# Patient Record
Sex: Male | Born: 1945 | Race: White | Hispanic: No | Marital: Married | Smoking: Former smoker
Health system: Southern US, Community
[De-identification: ages and names within clinical notes are randomized; demographics above are authoritative.]

## PROBLEM LIST (undated history)

## (undated) DIAGNOSIS — G4733 Obstructive sleep apnea (adult) (pediatric): Secondary | ICD-10-CM

## (undated) DIAGNOSIS — I2510 Atherosclerotic heart disease of native coronary artery without angina pectoris: Secondary | ICD-10-CM

## (undated) DIAGNOSIS — I4891 Unspecified atrial fibrillation: Secondary | ICD-10-CM

## (undated) DIAGNOSIS — I10 Essential (primary) hypertension: Secondary | ICD-10-CM

## (undated) DIAGNOSIS — C4491 Basal cell carcinoma of skin, unspecified: Secondary | ICD-10-CM

## (undated) DIAGNOSIS — E785 Hyperlipidemia, unspecified: Secondary | ICD-10-CM

## (undated) DIAGNOSIS — I509 Heart failure, unspecified: Secondary | ICD-10-CM

## (undated) DIAGNOSIS — Z8739 Personal history of other diseases of the musculoskeletal system and connective tissue: Secondary | ICD-10-CM

## (undated) DIAGNOSIS — K219 Gastro-esophageal reflux disease without esophagitis: Secondary | ICD-10-CM

## (undated) DIAGNOSIS — E119 Type 2 diabetes mellitus without complications: Secondary | ICD-10-CM

## (undated) HISTORY — DX: Essential (primary) hypertension: I10

## (undated) HISTORY — PX: INGUINAL HERNIA REPAIR: SUR1180

## (undated) HISTORY — PX: CARDIAC CATHETERIZATION: SHX172

## (undated) HISTORY — DX: Heart failure, unspecified: I50.9

## (undated) HISTORY — PX: KNEE ARTHROSCOPY: SHX127

## (undated) HISTORY — DX: Hyperlipidemia, unspecified: E78.5

## (undated) HISTORY — PX: REFRACTIVE SURGERY: SHX103

## (undated) HISTORY — PX: TONSILLECTOMY: SUR1361

## (undated) HISTORY — DX: Obstructive sleep apnea (adult) (pediatric): G47.33

## (undated) HISTORY — DX: Unspecified atrial fibrillation: I48.91

## (undated) HISTORY — PX: MOHS SURGERY: SUR867

## (undated) HISTORY — PX: WRIST FUSION: SHX839

## (undated) HISTORY — PX: EXCISIONAL HEMORRHOIDECTOMY: SHX1541

---

## 1974-03-09 HISTORY — PX: NASAL SEPTUM SURGERY: SHX37

## 2004-03-09 HISTORY — PX: SHOULDER OPEN ROTATOR CUFF REPAIR: SHX2407

## 2013-05-03 ENCOUNTER — Other Ambulatory Visit (HOSPITAL_COMMUNITY): Payer: Self-pay | Admitting: Family Medicine

## 2013-05-03 DIAGNOSIS — R9389 Abnormal findings on diagnostic imaging of other specified body structures: Secondary | ICD-10-CM

## 2013-05-08 ENCOUNTER — Telehealth (HOSPITAL_COMMUNITY): Payer: Self-pay | Admitting: *Deleted

## 2013-05-08 NOTE — Telephone Encounter (Signed)
MRI Screening  Pt Weight not in MRI (If over 300 lbs, notify MRI technologist)  Claustrophobia yes  Ever worked around metal, filing, grinding, or welding? no  Always wore protective glasses? yes  Ever got metal in your eyes? no If yes, was it removed by a doctor? no  Brain Surgery no   Inner Ear Surgery no  Renal/Liver Dz no  Heart Surgery no (if yes, what type and when 0 )  Pacemaker no  High BP no  Eye Implants no  Pregnancy no  Diabetic no  Stent no (If yes, date of placement 0 )  Hx of cancer no (If yes, what kind? 0 )   1.  Report to radiology on first floor on thur  At 1000am  2.  Do Not eat food after 4am          Do Not drink clear liquids after 8am  3.  If discharged the same day as procedure, you will need a responsible adult to drive          You home.  Who will drive you home wife   4.  If discharged the day of your procedure, a responsible adult should be with the patient      For 8 hours    wife 5.  If plans include a taxi or bus for transportation home after procedure, a friend or family      member must accompany you none       6.  If taking routine medications, please list the names and dosages of all your                   medications, or bring these medications to the hospital for identification allipurinol, atorvastatin,omeprazole, temazepam, trazadone, brilintix.  7.  Take usual medications the morning of your procedure, except none  8.  Do you have a history of heart problems?  none   9.  Do you have a cardiologist? n/a  10.  Do you have any metal or implants inside your body? no 11.  Do you have any allergies to food or medications? no 12.  Do you have any allergies to latex or contrast dye? no 13.  To whom were these instructions given? no

## 2013-05-11 ENCOUNTER — Other Ambulatory Visit (HOSPITAL_COMMUNITY): Payer: Self-pay | Admitting: Family Medicine

## 2013-05-11 ENCOUNTER — Ambulatory Visit (HOSPITAL_COMMUNITY)
Admission: RE | Admit: 2013-05-11 | Discharge: 2013-05-11 | Disposition: A | Discharge status: Home / Self Care | Payer: 59 | Source: Ambulatory Visit | Attending: Family Medicine | Admitting: Family Medicine

## 2013-05-11 ENCOUNTER — Encounter (HOSPITAL_COMMUNITY): Payer: Self-pay

## 2013-05-11 ENCOUNTER — Ambulatory Visit (HOSPITAL_COMMUNITY): Payer: 59 | Admitting: Anesthesiology

## 2013-05-11 ENCOUNTER — Encounter (HOSPITAL_COMMUNITY): Payer: 59 | Admitting: Anesthesiology

## 2013-05-11 ENCOUNTER — Encounter (HOSPITAL_COMMUNITY): Payer: Self-pay | Admitting: Anesthesiology

## 2013-05-11 ENCOUNTER — Encounter (HOSPITAL_COMMUNITY)
Admission: RE | Disposition: A | Discharge status: Home / Self Care | Payer: Self-pay | Source: Ambulatory Visit | Attending: Family Medicine

## 2013-05-11 ENCOUNTER — Other Ambulatory Visit (HOSPITAL_COMMUNITY): Payer: Self-pay | Admitting: Cardiology

## 2013-05-11 DIAGNOSIS — R911 Solitary pulmonary nodule: Secondary | ICD-10-CM | POA: Insufficient documentation

## 2013-05-11 DIAGNOSIS — I319 Disease of pericardium, unspecified: Secondary | ICD-10-CM | POA: Insufficient documentation

## 2013-05-11 DIAGNOSIS — J90 Pleural effusion, not elsewhere classified: Secondary | ICD-10-CM | POA: Insufficient documentation

## 2013-05-11 DIAGNOSIS — E049 Nontoxic goiter, unspecified: Secondary | ICD-10-CM | POA: Insufficient documentation

## 2013-05-11 DIAGNOSIS — M109 Gout, unspecified: Secondary | ICD-10-CM | POA: Insufficient documentation

## 2013-05-11 DIAGNOSIS — F411 Generalized anxiety disorder: Secondary | ICD-10-CM | POA: Insufficient documentation

## 2013-05-11 DIAGNOSIS — R9389 Abnormal findings on diagnostic imaging of other specified body structures: Secondary | ICD-10-CM

## 2013-05-11 DIAGNOSIS — Z87891 Personal history of nicotine dependence: Secondary | ICD-10-CM | POA: Insufficient documentation

## 2013-05-11 DIAGNOSIS — G4730 Sleep apnea, unspecified: Secondary | ICD-10-CM | POA: Insufficient documentation

## 2013-05-11 DIAGNOSIS — I517 Cardiomegaly: Secondary | ICD-10-CM | POA: Insufficient documentation

## 2013-05-11 DIAGNOSIS — I509 Heart failure, unspecified: Secondary | ICD-10-CM

## 2013-05-11 DIAGNOSIS — E785 Hyperlipidemia, unspecified: Secondary | ICD-10-CM | POA: Diagnosis not present

## 2013-05-11 DIAGNOSIS — I4891 Unspecified atrial fibrillation: Secondary | ICD-10-CM | POA: Diagnosis not present

## 2013-05-11 HISTORY — PX: RADIOLOGY WITH ANESTHESIA: SHX6223

## 2013-05-11 LAB — CBC
HEMATOCRIT: 40.5 % (ref 39.0–52.0)
HEMOGLOBIN: 13.9 g/dL (ref 13.0–17.0)
MCH: 31.1 pg (ref 26.0–34.0)
MCHC: 34.3 g/dL (ref 30.0–36.0)
MCV: 90.6 fL (ref 78.0–100.0)
Platelets: 207 10*3/uL (ref 150–400)
RBC: 4.47 MIL/uL (ref 4.22–5.81)
RDW: 13.5 % (ref 11.5–15.5)
WBC: 8.5 10*3/uL (ref 4.0–10.5)

## 2013-05-11 LAB — BASIC METABOLIC PANEL
BUN: 16 mg/dL (ref 6–23)
CALCIUM: 9.2 mg/dL (ref 8.4–10.5)
CHLORIDE: 105 meq/L (ref 96–112)
CO2: 27 meq/L (ref 19–32)
CREATININE: 0.8 mg/dL (ref 0.50–1.35)
GFR calc Af Amer: >90 mL/min (ref >90)
GFR calc non Af Amer: >90 mL/min (ref >90)
GLUCOSE: 107 mg/dL — AB (ref 70–99)
Potassium: 4.3 mEq/L (ref 3.7–5.3)
Sodium: 144 mEq/L (ref 137–147)

## 2013-05-11 LAB — PRO B NATRIURETIC PEPTIDE: Pro B Natriuretic peptide (BNP): 548.9 pg/mL — ABNORMAL HIGH (ref 0–125)

## 2013-05-11 LAB — GLUCOSE, CAPILLARY: Glucose-Capillary: 116 mg/dL — ABNORMAL HIGH (ref 70–99)

## 2013-05-11 SURGERY — RADIOLOGY WITH ANESTHESIA
Anesthesia: Monitor Anesthesia Care

## 2013-05-11 MED ORDER — LISINOPRIL 40 MG PO TABS
40.0000 mg | ORAL_TABLET | Freq: Every day | ORAL | Status: DC
  Administered 2013-05-11: 40 mg via ORAL
  Filled 2013-05-11 (×2): qty 1

## 2013-05-11 MED ORDER — IOHEXOL 350 MG/ML SOLN
100.0000 mL | Freq: Once | INTRAVENOUS | Status: AC | PRN
  Administered 2013-05-11: 100 mL via INTRAVENOUS

## 2013-05-11 NOTE — Transfer of Care (Signed)
Immediate Anesthesia Transfer of Care Note  Patient: Edgar Hogan  Procedure(s) Performed: Procedure(s): RADIOLOGY WITH ANESTHESIA-CT (N/A)  Patient Location: PACU  Anesthesia Type:MAC  Level of Consciousness: awake, alert  and oriented  Airway & Oxygen Therapy: Patient Spontanous Breathing and Patient connected to nasal cannula oxygen  Post-op Assessment: Report given to PACU RN  Post vital signs: Reviewed and stable  Complications: No apparent anesthesia complications

## 2013-05-11 NOTE — Preoperative (Signed)
Beta Blockers   Reason not to administer Beta Blockers:Not Applicable 

## 2013-05-11 NOTE — Anesthesia Preprocedure Evaluation (Addendum)
Anesthesia Evaluation  Patient identified by MRN, date of birth, ID band Patient awake    Reviewed: Allergy & Precautions, H&P , NPO status , Patient's Chart, lab work & pertinent test results  Airway Mallampati: II TM Distance: >3 FB Neck ROM: Full    Dental  (+) Teeth Intact   Pulmonary sleep apnea ,    Pulmonary exam normal       Cardiovascular hypertension, Pt. on medications Rhythm:Regular Rate:Normal     Neuro/Psych    GI/Hepatic GERD-  Medicated and Controlled,  Endo/Other  diabetes, Type 2, Oral Hypoglycemic Agents  Renal/GU      Musculoskeletal   Abdominal Normal abdominal exam  (+)   Peds  Hematology   Anesthesia Other Findings Clausterphobia, anxiety.  DM Glucose 129, Aortic arch aneurysm.  Reproductive/Obstetrics                          Anesthesia Physical Anesthesia Plan  ASA: III  Anesthesia Plan: MAC   Post-op Pain Management:    Induction: Intravenous  Airway Management Planned: Nasal Cannula  Additional Equipment:   Intra-op Plan:   Post-operative Plan:   Informed Consent: I have reviewed the patients History and Physical, chart, labs and discussed the procedure including the risks, benefits and alternatives for the proposed anesthesia with the patient or authorized representative who has indicated his/her understanding and acceptance.   Dental advisory given  Plan Discussed with: CRNA and Surgeon  Anesthesia Plan Comments:        Anesthesia Quick Evaluation

## 2013-05-11 NOTE — Consult Note (Signed)
CARDIOLOGY CONSULT NOTE  Patient ID: Edgar Hogan MRN: [id number] DOB/AGE: 05-01-1945 68 y.o.  Admit date: 05/11/2013 Primary Physician: Dr Nolon Rod Primary Cardiologist: Luanna Cole Reason for Consultation: Atrial fibrillation  HPI: 68 yo with history of HTN, type II diabetes, OSA was found to be in atrial fibrillation today. Patient had an CXR done not long ago with concern for thoracic aortic aneurysm.  He is claustrophobic so came in for general anesthesia to get CT done.  Initially, anesthesia flow sheets say he was in NSR.  However, at the end of the procedure, he was noted to be in atrial fibrillation.  ECG confirmed atrial fibrillation with controlled rate.    Patient has had exertional dyspnea for about 2 wks.  He has noted dyspnea walking up steps and [place].  No orthopnea.  He has not noted tachypalpitations or irregular heart rhythm.  No PND.  He has been having episodes of chest tightness but he is not sure what in particular brings the episodes on.  They are not related to exertion and can last for a few minutes at a time. He has no history of significant cardiac abnormalities.  He had LHC in 2007 with mild luminal irregularities.    The CTA today showed no aortic aneurysm but there was evidence for mild pulmonary edema and there was coronary calcification.   Review of systems complete and found to be negative unless listed above in HPI  Past Medical History: 1. HTN 2. Type II diabetes 3. Gout 4. Hyperlipidemia 5. OSA: Not on CPAP 6. Atrial fibrillation: 1st diagnosed 3/15.  7. Gray 2007 with luminal irregularities.   SH: Prior smoker, quit 1983.  Married, wife is dermatopathologist.   FH: CAD   Prescriptions prior to admission  Medication Sig Dispense Refill  . allopurinol (ZYLOPRIM) 100 MG tablet Take 100 mg by mouth daily.      Marland Kitchen atorvastatin (LIPITOR) 20 MG tablet Take 20 mg by mouth daily at 6 PM.      . metFORMIN (GLUCOPHAGE) 500 MG  tablet Take 500 mg by mouth 2 (two) times daily with a meal.      . omeprazole (PRILOSEC) 20 MG capsule Take 20 mg by mouth 2 (two) times daily before a meal.      . traZODone (DESYREL) 100 MG tablet Take 100 mg by mouth at bedtime.        Physical exam Blood pressure 132/97, pulse 65, temperature 97.6 F (36.4 C), resp. rate 18, SpO2 97.00%. General: NAD Neck: Thick, JVP 9-10 cm, no thyromegaly or thyroid nodule.  Lungs: Crackles at bases bilaterally.  CV: Nondisplaced PMI.  Heart irregular S1/S2, no S3/S4, no murmur.  No peripheral edema.  No carotid bruit.  Normal pedal pulses.  Abdomen: Soft, nontender, no hepatosplenomegaly, no distention.  Skin: Intact without lesions or rashes.  Neurologic: Alert and oriented x 3.  Psych: Normal affect. Extremities: No clubbing or cyanosis.  HEENT: Normal.   Labs: Creatinine 0.8   Radiology: - CTA chest: IMPRESSION:  1. No evidence of aortic aneurysm. There is peripheral calcified  collection measures 26.6 Hounsfield units in attenuation. Located  just posterior to descending aorta posterior mediastinum .This is  most likely due to previous calcified hematoma or loculated  effusion. Less likely AP views aortic aneurysm. There is well  preserved fat plane between the collection and descending aorta. The  collection measures 4.8 x 2.5 cm.  2. Bilateral small pleural effusion right greater than  left.  Bilateral lower lobe posterior atelectasis. Central mild vascular  congestion and mild interstitial prominence especially lower lobe  suspicious for mild interstitial edema. Mild emphysematous changes.  3. Cardiomegaly is noted. Small pericardial effusion.  4. There is 8 mm nodule in right upper lobe centrally. Follow-up  examination in 3 months is recommended to assure stability.  5. 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 Largest  anterior nodule measures 3 cm. Further correlation with thyroid  gland ultrasound is  recommended.  EKG: atrial fibrillation at 81, QTc 497 msec  ASSESSMENT AND PLAN:  68 yo with history of HTN, type II diabetes, OSA was found to be in atrial fibrillation today. Patient had an CXR done not long ago with concern for thoracic aortic aneurysm.  1. Abnormal CXR: Patient does not have a thoracic aortic aneurysm.  2. Atrial fibrillation: Patient is in rate-controlled atrial fibrillation.  Per anesthesia flow sheets, he was in NSR when he arrived.  He has had exertional dyspnea x 2 wks.  I wonder if he has been in and out of atrial fibrillation or in atrial fibrillation the whole time over that period.  Risk factors for atrial fibrillation include OSA, HTN, diabetes.  CHADSVASC = 3.  - I am going to start him on apixaban 5 mg bid.  - Start Toprol XL 25 mg daily.  - Check TSH today.  3. CHF: Patient is volume overloaded on exam.  He has NYHA class II-III symptoms.  He has had atypical chest pain and has coronary calcification on CT.  He has some pulmonary edema on CT.  - Echocardiogram tomorrow.  - If echo is normal, will probably arrange for Cardiolite.   - If echo shows depressed EF, he will need cardiac cath.  - He is volume overloaded and short of breath.  I will start him on Lasix 20 mg daily with KCl 10 mEq daily.   [name] 05/11/2013 2:34 PM   Signed: @ME1 @ 05/11/2013, 2:15 PM Co-Sign MD

## 2013-05-11 NOTE — Discharge Instructions (Signed)

## 2013-05-12 ENCOUNTER — Ambulatory Visit (HOSPITAL_COMMUNITY): Payer: PRIVATE HEALTH INSURANCE | Attending: Cardiology | Admitting: Radiology

## 2013-05-12 ENCOUNTER — Encounter (HOSPITAL_COMMUNITY): Payer: Self-pay | Admitting: Radiology

## 2013-05-12 ENCOUNTER — Encounter: Payer: Self-pay | Admitting: Cardiology

## 2013-05-12 DIAGNOSIS — I059 Rheumatic mitral valve disease, unspecified: Secondary | ICD-10-CM | POA: Insufficient documentation

## 2013-05-12 DIAGNOSIS — I509 Heart failure, unspecified: Secondary | ICD-10-CM | POA: Insufficient documentation

## 2013-05-12 DIAGNOSIS — I4891 Unspecified atrial fibrillation: Secondary | ICD-10-CM | POA: Insufficient documentation

## 2013-05-12 DIAGNOSIS — I079 Rheumatic tricuspid valve disease, unspecified: Secondary | ICD-10-CM | POA: Insufficient documentation

## 2013-05-12 LAB — POCT I-STAT, CHEM 8
BUN: 19 mg/dL (ref 6–23)
CALCIUM ION: 1.21 mmol/L (ref 1.13–1.30)
CHLORIDE: 108 meq/L (ref 96–112)
CREATININE: 0.8 mg/dL (ref 0.50–1.35)
Glucose, Bld: 129 mg/dL — ABNORMAL HIGH (ref 70–99)
HCT: 39 % (ref 39.0–52.0)
Hemoglobin: 13.3 g/dL (ref 13.0–17.0)
Potassium: 3.6 mEq/L — ABNORMAL LOW (ref 3.7–5.3)
Sodium: 144 mEq/L (ref 137–147)
TCO2: 22 mmol/L (ref 0–100)

## 2013-05-12 LAB — TSH: TSH: 1.582 u[IU]/mL (ref 0.350–4.500)

## 2013-05-12 NOTE — Progress Notes (Signed)
Echocardiogram performed.  

## 2013-05-16 ENCOUNTER — Ambulatory Visit (INDEPENDENT_AMBULATORY_CARE_PROVIDER_SITE_OTHER): Payer: 59 | Admitting: Cardiology

## 2013-05-16 ENCOUNTER — Encounter: Payer: Self-pay | Admitting: *Deleted

## 2013-05-16 ENCOUNTER — Encounter (HOSPITAL_COMMUNITY): Payer: Self-pay | Admitting: Pharmacy Technician

## 2013-05-16 ENCOUNTER — Encounter: Payer: Self-pay | Admitting: Cardiology

## 2013-05-16 VITALS — BP 134/90 | HR 90 | Ht 74.5 in | Wt 247.0 lb

## 2013-05-16 DIAGNOSIS — E119 Type 2 diabetes mellitus without complications: Secondary | ICD-10-CM

## 2013-05-16 DIAGNOSIS — I2510 Atherosclerotic heart disease of native coronary artery without angina pectoris: Secondary | ICD-10-CM | POA: Insufficient documentation

## 2013-05-16 DIAGNOSIS — I10 Essential (primary) hypertension: Secondary | ICD-10-CM | POA: Insufficient documentation

## 2013-05-16 DIAGNOSIS — I509 Heart failure, unspecified: Secondary | ICD-10-CM

## 2013-05-16 DIAGNOSIS — G4733 Obstructive sleep apnea (adult) (pediatric): Secondary | ICD-10-CM

## 2013-05-16 DIAGNOSIS — R079 Chest pain, unspecified: Secondary | ICD-10-CM

## 2013-05-16 DIAGNOSIS — I4891 Unspecified atrial fibrillation: Secondary | ICD-10-CM | POA: Insufficient documentation

## 2013-05-16 DIAGNOSIS — I5032 Chronic diastolic (congestive) heart failure: Secondary | ICD-10-CM | POA: Insufficient documentation

## 2013-05-16 LAB — CBC WITH DIFFERENTIAL/PLATELET
Basophils Absolute: 0 10*3/uL (ref 0.0–0.1)
Basophils Relative: 0.5 % (ref 0.0–3.0)
EOS ABS: 0.2 10*3/uL (ref 0.0–0.7)
Eosinophils Relative: 2.8 % (ref 0.0–5.0)
HEMATOCRIT: 40.6 % (ref 39.0–52.0)
HEMOGLOBIN: 13.5 g/dL (ref 13.0–17.0)
LYMPHS ABS: 1.8 10*3/uL (ref 0.7–4.0)
LYMPHS PCT: 24.6 % (ref 12.0–46.0)
MCHC: 33.2 g/dL (ref 30.0–36.0)
MCV: 92.1 fl (ref 78.0–100.0)
MONOS PCT: 6.9 % (ref 3.0–12.0)
Monocytes Absolute: 0.5 10*3/uL (ref 0.1–1.0)
NEUTROS ABS: 4.8 10*3/uL (ref 1.4–7.7)
Neutrophils Relative %: 65.2 % (ref 43.0–77.0)
Platelets: 223 10*3/uL (ref 150.0–400.0)
RBC: 4.4 Mil/uL (ref 4.22–5.81)
RDW: 13.9 % (ref 11.5–14.6)
WBC: 7.3 10*3/uL (ref 4.5–10.5)

## 2013-05-16 LAB — BASIC METABOLIC PANEL
BUN: 21 mg/dL (ref 6–23)
CALCIUM: 9.1 mg/dL (ref 8.4–10.5)
CO2: 24 mEq/L (ref 19–32)
Chloride: 107 mEq/L (ref 96–112)
Creatinine, Ser: 0.9 mg/dL (ref 0.4–1.5)
GFR: 86.38 mL/min (ref >60.00)
Glucose, Bld: 134 mg/dL — ABNORMAL HIGH (ref 70–99)
Potassium: 4.2 mEq/L (ref 3.5–5.1)
Sodium: 140 mEq/L (ref 135–145)

## 2013-05-16 LAB — PROTIME-INR
INR: 1.3 ratio — ABNORMAL HIGH (ref 0.8–1.0)
PROTHROMBIN TIME: 13.9 s — AB (ref 10.2–12.4)

## 2013-05-16 MED ORDER — POTASSIUM CHLORIDE CRYS ER 20 MEQ PO TBCR: 20.0000 meq | EXTENDED_RELEASE_TABLET | Freq: Every day | ORAL | Status: DC

## 2013-05-16 MED ORDER — FUROSEMIDE 40 MG PO TABS
40.0000 mg | ORAL_TABLET | Freq: Every day | ORAL | Status: DC
Start: 2013-05-16 — End: 2013-05-16

## 2013-05-16 NOTE — Patient Instructions (Signed)
Increase lasix (furosmide) to 40mg  daily. You can take 2 of your 20mg  tablets daily at the same time and use your current supply.  Increase KCL(potassium) to 20 mEq daily. You can take 2 of your 10 mEq tablets daily at the same time and use your current supply.  Your physician recommends that you return for lab work today--BMET/CBCd/PT/INR.  Your physician has requested that you have a cardiac catheterization. Cardiac catheterization is used to diagnose and/or treat various heart conditions. Doctors may recommend this procedure for a number of different reasons. The most common reason is to evaluate chest pain. Chest pain can be a symptom of coronary artery disease (CAD), and cardiac catheterization can show whether plaque is narrowing or blocking your heart's arteries. This procedure is also used to evaluate the valves, as well as measure the blood flow and oxygen levels in different parts of your heart. For further information please visit [email or web address]. Please follow instruction sheet, as given. Thursday March 12,2015  Dr Aundra Dubin will discuss follow up with you after the cardiac catheterization.

## 2013-05-16 NOTE — Progress Notes (Signed)
Patient ID: Edgar Hogan, male   DOB: 1945-10-09, 68 y.o.   MRN: [id number] PCP: Dr. Melford Aase  68 yo with history of HTN, type II diabetes, OSA, and recently identified atrial fibrillation presents for cardiology followup today. Patient had an CXR done not long ago with concern for thoracic aortic aneurysm. He is claustrophobic so came to [facility] on 3/5 for general anesthesia to get CTA chest done. Initially, anesthesia flow sheets say he was in NSR. However, at the end of the procedure, he was noted to be in atrial fibrillation. ECG confirmed atrial fibrillation with controlled rate. The CTA showed no aortic aneurysm but there was evidence for mild pulmonary edema and there was coronary calcification.  I saw him in the hospital and started him on a low dose of Toprol XL as well as Eliquis 5 mg bid.   Patient has had exertional dyspnea for about 3 wks. He has noted dyspnea walking up steps and walking on the golf [place]. Currently, he is short of breath after walking about 15 yards. He has had orthopnea. He has not noted tachypalpitations or irregular heart rhythm. No PND. He has been having episodes of chest tightness but he is not sure what in particular brings the episodes on. They are not definitively related to exertion and can last for a few minutes at a time. He has no history of significant cardiac abnormalities. He had LHC in 2007 with mild luminal irregularities. I had him do an echocardiogram, which showed EF 40-45% with diffuse hypokinesis.  When I saw him in the hospital, I also had him start on Lasix 20 mg daily.  So far, this has not helped his breathing much.   Labs (3/15): K 4.3, creatinine 0.8, BNP 549, TSH normal, HCT 40.5  Review of systems complete and found to be negative unless listed above in HPI    Past Medical History:  1. HTN  2. Type II diabetes  3. Gout  4. Hyperlipidemia  5. OSA: Not on CPAP (does not tolerate).   6. Atrial fibrillation: 1st diagnosed 3/15. Echo (3/15)  with EF 40-45%, diffuse hypokinesis, moderate LVH, moderate MR, moderate LAE, moderately dilated RV with normal RV systolic function.  7. CAD: LHC 2007 with luminal irregularities. CTA chest (3/15) with coronary artery calcifications.   SH: Prior smoker, quit 1983. Married, wife is dermatopathologist. Has children.  Retired.    FH: CAD    Current Outpatient Prescriptions  Medication Sig Dispense Refill  . allopurinol (ZYLOPRIM) 100 MG tablet Take 100 mg by mouth daily.      Marland Kitchen atorvastatin (LIPITOR) 20 MG tablet Take 20 mg by mouth daily at 6 PM.      . metFORMIN (GLUCOPHAGE) 500 MG tablet Take 500 mg by mouth 2 (two) times daily with a meal.      . omeprazole (PRILOSEC) 20 MG capsule Take 20 mg by mouth 2 (two) times daily before a meal.      . traZODone (DESYREL) 100 MG tablet Take 100 mg by mouth at bedtime.      Marland Kitchen apixaban (ELIQUIS) 5 MG TABS tablet Take 1 tablet (5 mg total) by mouth 2 (two) times daily.      . furosemide (LASIX) 40 MG tablet Take 1 tablet (40 mg total) by mouth daily.  30 tablet  3  . metoprolol succinate (TOPROL XL) 25 MG 24 hr tablet Take 1 tablet (25 mg total) by mouth daily.      . potassium chloride SA (K-DUR,KLOR-CON)  20 MEQ tablet Take 1 tablet (20 mEq total) by mouth daily.  30 tablet  3   No current facility-administered medications for this visit.    BP 134/90  Pulse 90  Ht 6' 2.5" (1.892 m)  Wt 247 lb (112.038 kg)  BMI 31.30 kg/m2 General: NAD Neck: Thick, JVP 8-9 cm, no thyromegaly or thyroid nodule.  Lungs: Clear to auscultation bilaterally with normal respiratory effort. CV: Nondisplaced PMI.  Heart regular S1/S2, no S3/S4, no murmur.  No peripheral edema.  No carotid bruit.  Normal pedal pulses.  Abdomen: Soft, nontender, no hepatosplenomegaly, mild distention.  Skin: Intact without lesions or rashes.  Neurologic: Alert and oriented x 3.  Psych: Normal affect. Extremities: No clubbing or cyanosis.  HEENT: Normal.   Assessment/Plan:  1.  Abnormal CXR: Patient does not have a thoracic aortic aneurysm by CTA.  2. Atrial fibrillation: Patient is in rate-controlled atrial fibrillation, now on Toprol XL 25 daily and Eliquis. He has had exertional dyspnea x 3 wks.  It is possible that he has been in atrial fibrillation the whole time over that period. Risk factors for CVA include OSA, HTN, diabetes, CHF, and age>65. CHADSVASC = 5.   I do not think that he tolerates atrial fibrillation very well and suspect that it is contributing to his CHF.  - Continue Eliquis and Toprol XL. - I would like to get him out of atrial fibrillation given symptoms.  Will aim for TEE-guided DCCV after LHC (see discussion below) => probably next week.  3. Chronic systolic CHF: Patient is volume overloaded on exam still despite starting Lasix 20 mg daily. He has NYHA class III symptoms. EF 40-45% on echo.  Cardiomyopathy of uncertain etiology: atrial fibrillation has not been rapid even when he was not on Toprol XL, so seems less likely due to be tachy-mediated cardiomyopathy.   He needs an evaluation for CAD.  - Continue Toprol XL.  - Increase Lasix to 40 mg daily with KCl 20 mEq daily, needs BMET today and in 2 wks.  - Will plan to start ACEI after LHC. 4. CAD: Exertional dyspnea, low EF of uncertain etiology, coronary calcification on CT chest, and atypical chest pain.  I think that he needs left heart cath to see if coronary disease is the cause of his cardiomyopathy and his symptoms.  He has multiple risk factors (DM, HTN, hyperlipidemia).   - I will plan LHC this Thursday. He will need to hold his Eliquis Wednesday and Thursday.   - Continue statin.  5. OSA: Does not tolerate CPAP.   [name] 05/16/2013 12:47 PM

## 2013-05-18 ENCOUNTER — Encounter (HOSPITAL_COMMUNITY)
Admission: RE | Disposition: A | Discharge status: Home / Self Care | Payer: Self-pay | Source: Ambulatory Visit | Attending: Cardiology

## 2013-05-18 ENCOUNTER — Telehealth: Payer: Self-pay | Admitting: Cardiology

## 2013-05-18 ENCOUNTER — Other Ambulatory Visit: Payer: Self-pay | Admitting: Cardiology

## 2013-05-18 ENCOUNTER — Ambulatory Visit (HOSPITAL_COMMUNITY)
Admission: RE | Admit: 2013-05-18 | Discharge: 2013-05-18 | Disposition: A | Discharge status: Home / Self Care | Payer: PRIVATE HEALTH INSURANCE | Source: Ambulatory Visit | Attending: Cardiology | Admitting: Cardiology

## 2013-05-18 DIAGNOSIS — G4733 Obstructive sleep apnea (adult) (pediatric): Secondary | ICD-10-CM | POA: Insufficient documentation

## 2013-05-18 DIAGNOSIS — M109 Gout, unspecified: Secondary | ICD-10-CM | POA: Insufficient documentation

## 2013-05-18 DIAGNOSIS — I10 Essential (primary) hypertension: Secondary | ICD-10-CM | POA: Insufficient documentation

## 2013-05-18 DIAGNOSIS — E119 Type 2 diabetes mellitus without complications: Secondary | ICD-10-CM | POA: Insufficient documentation

## 2013-05-18 DIAGNOSIS — I2510 Atherosclerotic heart disease of native coronary artery without angina pectoris: Secondary | ICD-10-CM | POA: Insufficient documentation

## 2013-05-18 DIAGNOSIS — I2584 Coronary atherosclerosis due to calcified coronary lesion: Secondary | ICD-10-CM | POA: Insufficient documentation

## 2013-05-18 DIAGNOSIS — E785 Hyperlipidemia, unspecified: Secondary | ICD-10-CM | POA: Insufficient documentation

## 2013-05-18 DIAGNOSIS — Z87891 Personal history of nicotine dependence: Secondary | ICD-10-CM | POA: Insufficient documentation

## 2013-05-18 DIAGNOSIS — I4891 Unspecified atrial fibrillation: Secondary | ICD-10-CM | POA: Insufficient documentation

## 2013-05-18 DIAGNOSIS — Z7901 Long term (current) use of anticoagulants: Secondary | ICD-10-CM | POA: Insufficient documentation

## 2013-05-18 DIAGNOSIS — I428 Other cardiomyopathies: Secondary | ICD-10-CM | POA: Insufficient documentation

## 2013-05-18 HISTORY — PX: LEFT HEART CATHETERIZATION WITH CORONARY ANGIOGRAM: SHX5451

## 2013-05-18 LAB — GLUCOSE, CAPILLARY
GLUCOSE-CAPILLARY: 142 mg/dL — AB (ref 70–99)
Glucose-Capillary: 133 mg/dL — ABNORMAL HIGH (ref 70–99)

## 2013-05-18 SURGERY — LEFT HEART CATHETERIZATION WITH CORONARY ANGIOGRAM
Anesthesia: LOCAL

## 2013-05-18 MED ORDER — MIDAZOLAM HCL 2 MG/2ML IJ SOLN
INTRAMUSCULAR | Status: AC
  Filled 2013-05-18: qty 2

## 2013-05-18 MED ORDER — SODIUM CHLORIDE 0.9 % IV SOLN: 250.0000 mL | INTRAVENOUS | Status: DC | PRN

## 2013-05-18 MED ORDER — SODIUM CHLORIDE 0.9 % IJ SOLN
3.0000 mL | INTRAMUSCULAR | Status: DC | PRN
Start: 2013-05-18 — End: 2013-05-18
  Administered 2013-05-18: 3 mL via INTRAVENOUS

## 2013-05-18 MED ORDER — SODIUM CHLORIDE 0.9 % IJ SOLN: 3.0000 mL | Freq: Two times a day (BID) | INTRAMUSCULAR | Status: DC

## 2013-05-18 MED ORDER — FENTANYL CITRATE 0.05 MG/ML IJ SOLN
INTRAMUSCULAR | Status: AC
  Filled 2013-05-18: qty 2

## 2013-05-18 MED ORDER — ONDANSETRON HCL 4 MG/2ML IJ SOLN: 4.0000 mg | Freq: Four times a day (QID) | INTRAMUSCULAR | Status: DC | PRN

## 2013-05-18 MED ORDER — ASPIRIN 81 MG PO CHEW
81.0000 mg | CHEWABLE_TABLET | ORAL | Status: AC
  Administered 2013-05-18: 81 mg via ORAL
  Filled 2013-05-18: qty 1

## 2013-05-18 MED ORDER — SODIUM CHLORIDE 0.9 % IV SOLN
INTRAVENOUS | Status: DC
  Administered 2013-05-18: 07:00:00 via INTRAVENOUS

## 2013-05-18 MED ORDER — ACETAMINOPHEN 325 MG PO TABS: 650.0000 mg | ORAL_TABLET | ORAL | Status: DC | PRN

## 2013-05-18 MED ORDER — HEPARIN SODIUM (PORCINE) 1000 UNIT/ML IJ SOLN
INTRAMUSCULAR | Status: AC
  Filled 2013-05-18: qty 1

## 2013-05-18 MED ORDER — HEPARIN (PORCINE) IN NACL 2-0.9 UNIT/ML-% IJ SOLN
INTRAMUSCULAR | Status: AC
  Filled 2013-05-18: qty 1000

## 2013-05-18 MED ORDER — LIDOCAINE HCL (PF) 1 % IJ SOLN
INTRAMUSCULAR | Status: AC
  Filled 2013-05-18: qty 30

## 2013-05-18 MED ORDER — VERAPAMIL HCL 2.5 MG/ML IV SOLN
INTRAVENOUS | Status: AC
  Filled 2013-05-18: qty 2

## 2013-05-18 NOTE — Telephone Encounter (Signed)
Pt wife, Dr. Hedwig Morton, calls regarding scheduling of cardioversion for pt on 05/26/13 Pt was already scheduled for a 10:00am cardioversion on 05/26/13.    labs drawn for cath done today will still be within time frame.  She is aware pt should be NPO after midnight & report to main entrance at 8:30am. Horton Chin RN

## 2013-05-18 NOTE — Interval H&P Note (Signed)
Cath Lab Visit (complete for each Cath Lab visit)  Clinical Evaluation Leading to the Procedure:   ACS: no  Non-ACS:    Anginal Classification: CCS III  Anti-ischemic medical therapy: Minimal Therapy (1 class of medications)  Non-Invasive Test Results: No non-invasive testing performed  Prior CABG: No previous CABG      History and Physical Interval Note:  05/18/2013 9:02 AM  Edgar Hogan  has presented today for surgery, with the diagnosis of chest pain  The various methods of treatment have been discussed with the patient and family. After consideration of risks, benefits and other options for treatment, the patient has consented to  Procedure(s): LEFT HEART CATHETERIZATION WITH CORONARY ANGIOGRAM (N/A) as a surgical intervention .  The patient's history has been reviewed, patient examined, no change in status, stable for surgery.  I have reviewed the patient's chart and labs.  Questions were answered to the patient's satisfaction.     [name] [employer]

## 2013-05-18 NOTE — CV Procedure (Signed)
    Cardiac Catheterization Procedure Note  Name: Edgar Hogan MRN: [id number] DOB: 1946/02/24  Procedure: Left Heart Cath, Selective Coronary Angiography, LV angiography  Indication: Cardiomyopathy of uncertain etiology, exertional dyspnea.    Procedural Details: Obie's test on the right wrist was positive. The right wrist was prepped, draped, and anesthetized with 1% lidocaine. Using the modified Seldinger technique, a 5 French sheath was introduced into the right radial artery. 3 mg of verapamil was administered through the sheath, weight-based unfractionated heparin was administered intravenously. Standard Judkins catheters were used for selective coronary angiography and left ventriculography. Catheter exchanges were performed over an exchange length guidewire. There were no immediate procedural complications. A TR band was used for radial hemostasis at the completion of the procedure.  The patient was transferred to the post catheterization recovery area for further monitoring.  Procedural Findings: Hemodynamics: AO 146/94 LV 136/14  Coronary angiography: Coronary dominance: right  Left mainstem: Mild distal tapering, 20% stenosis.   Left anterior descending (LAD): Mild luminal irregularities in the LAD.   Left circumflex (LCx): Large ramus with 30% mid-vessel stenosis.  30% ostial LCx stenosis.   Right coronary artery (RCA): No significant CAD.   Left ventriculography: Mild global hypokinesis, EF 45%.   Final Conclusions:  Nonobstructive mild CAD.  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   Recommendations: Restart apixaban tomorrow morning, plan TEE-guided DCCV next week.   [name] 05/18/2013, 9:32 AM

## 2013-05-18 NOTE — H&P (View-Only) (Signed)
Patient ID: Edgar Hogan, male   DOB: 10/15/1945, 68 y.o.   MRN: [id number] PCP: Dr. Badger  68 yo with history of HTN, type II diabetes, OSA, and recently identified atrial fibrillation presents for cardiology followup today. Patient had an CXR done not long ago with concern for thoracic aortic aneurysm. He is claustrophobic so came to [place] on 3/5 for general anesthesia to get CTA chest done. Initially, anesthesia flow sheets say he was in NSR. However, at the end of the procedure, he was noted to be in atrial fibrillation. ECG confirmed atrial fibrillation with controlled rate. The CTA showed no aortic aneurysm but there was evidence for mild pulmonary edema and there was coronary calcification.  I saw him in the hospital and started him on a low dose of Toprol XL as well as Eliquis 5 mg bid.   Patient has had exertional dyspnea for about 3 wks. He has noted dyspnea walking up steps and [place]. Currently, he is short of breath after walking about 15 yards. He has had orthopnea. He has not noted tachypalpitations or irregular heart rhythm. No PND. He has been having episodes of chest tightness but he is not sure what in particular brings the episodes on. They are not definitively related to exertion and can last for a few minutes at a time. He has no history of significant cardiac abnormalities. He had LHC in 2007 with mild luminal irregularities. I had him do an echocardiogram, which showed EF 40-45% with diffuse hypokinesis.  When I saw him in the hospital, I also had him start on Lasix 20 mg daily.  So far, this has not helped his breathing much.   Labs (3/15): K 4.3, creatinine 0.8, BNP 549, TSH normal, HCT 40.5  Review of systems complete and found to be negative unless listed above in HPI    Past Medical History:  1. HTN  2. Type II diabetes  3. Gout  4. Hyperlipidemia  5. OSA: Not on CPAP (does not tolerate).   6. Atrial fibrillation: 1st diagnosed 3/15. Echo (3/15)  with EF 40-45%, diffuse hypokinesis, moderate LVH, moderate MR, moderate LAE, moderately dilated RV with normal RV systolic function.  7. CAD: LHC 2007 with luminal irregularities. CTA chest (3/15) with coronary artery calcifications.   SH: Prior smoker, quit 1983. Married, wife is dermatopathologist. Has children.  Retired.    FH: CAD    Current Outpatient Prescriptions  Medication Sig Dispense Refill  . allopurinol (ZYLOPRIM) 100 MG tablet Take 100 mg by mouth daily.      . atorvastatin (LIPITOR) 20 MG tablet Take 20 mg by mouth daily at 6 PM.      . metFORMIN (GLUCOPHAGE) 500 MG tablet Take 500 mg by mouth 2 (two) times daily with a meal.      . omeprazole (PRILOSEC) 20 MG capsule Take 20 mg by mouth 2 (two) times daily before a meal.      . traZODone (DESYREL) 100 MG tablet Take 100 mg by mouth at bedtime.      . apixaban (ELIQUIS) 5 MG TABS tablet Take 1 tablet (5 mg total) by mouth 2 (two) times daily.      . furosemide (LASIX) 40 MG tablet Take 1 tablet (40 mg total) by mouth daily.  30 tablet  3  . metoprolol succinate (TOPROL XL) 25 MG 24 hr tablet Take 1 tablet (25 mg total) by mouth daily.      . potassium chloride SA (K-DUR,KLOR-CON)   20 MEQ tablet Take 1 tablet (20 mEq total) by mouth daily.  30 tablet  3   No current facility-administered medications for this visit.    BP 134/90  Pulse 90  Ht 6' 2.5" (1.892 m)  Wt 247 lb (112.038 kg)  BMI 31.30 kg/m2 General: NAD Neck: Thick, JVP 8-9 cm, no thyromegaly or thyroid nodule.  Lungs: Clear to auscultation bilaterally with normal respiratory effort. CV: Nondisplaced PMI.  Heart regular S1/S2, no S3/S4, no murmur.  No peripheral edema.  No carotid bruit.  Normal pedal pulses.  Abdomen: Soft, nontender, no hepatosplenomegaly, mild distention.  Skin: Intact without lesions or rashes.  Neurologic: Alert and oriented x 3.  Psych: Normal affect. Extremities: No clubbing or cyanosis.  HEENT: Normal.   Assessment/Plan:  1.  Abnormal CXR: Patient does not have a thoracic aortic aneurysm by CTA.  2. Atrial fibrillation: Patient is in rate-controlled atrial fibrillation, now on Toprol XL 25 daily and Eliquis. He has had exertional dyspnea x 3 wks.  It is possible that he has been in atrial fibrillation the whole time over that period. Risk factors for CVA include OSA, HTN, diabetes, CHF, and age>65. CHADSVASC = 5.   I do not think that he tolerates atrial fibrillation very well and suspect that it is contributing to his CHF.  - Continue Eliquis and Toprol XL. - I would like to get him out of atrial fibrillation given symptoms.  Will aim for TEE-guided DCCV after LHC (see discussion below) => probably next week.  3. Chronic systolic CHF: Patient is volume overloaded on exam still despite starting Lasix 20 mg daily. He has NYHA class III symptoms. EF 40-45% on echo.  Cardiomyopathy of uncertain etiology: atrial fibrillation has not been rapid even when he was not on Toprol XL, so seems less likely due to be tachy-mediated cardiomyopathy.   He needs an evaluation for CAD.  - Continue Toprol XL.  - Increase Lasix to 40 mg daily with KCl 20 mEq daily, needs BMET today and in 2 wks.  - Will plan to start ACEI after LHC. 4. CAD: Exertional dyspnea, low EF of uncertain etiology, coronary calcification on CT chest, and atypical chest pain.  I think that he needs left heart cath to see if coronary disease is the cause of his cardiomyopathy and his symptoms.  He has multiple risk factors (DM, HTN, hyperlipidemia).   - I will plan LHC this Thursday. He will need to hold his Eliquis Wednesday and Thursday.   - Continue statin.  5. OSA: Does not tolerate CPAP.   [name] 05/16/2013 12:47 PM

## 2013-05-18 NOTE — Telephone Encounter (Signed)
New message           Pt would like to have the cardioversion on Friday 3/20. They were given orders by dr Aundra Dubin to not leave shortstay today until they have an appt for the cardioversion

## 2013-05-18 NOTE — Discharge Instructions (Signed)

## 2013-05-26 ENCOUNTER — Ambulatory Visit (HOSPITAL_COMMUNITY)
Admission: RE | Admit: 2013-05-26 | Discharge: 2013-05-26 | Disposition: A | Discharge status: Home / Self Care | Payer: PRIVATE HEALTH INSURANCE | Source: Ambulatory Visit | Attending: Cardiology | Admitting: Cardiology

## 2013-05-26 ENCOUNTER — Ambulatory Visit (HOSPITAL_COMMUNITY): Payer: PRIVATE HEALTH INSURANCE | Admitting: Certified Registered Nurse Anesthetist

## 2013-05-26 ENCOUNTER — Encounter (HOSPITAL_COMMUNITY): Payer: PRIVATE HEALTH INSURANCE | Admitting: Certified Registered Nurse Anesthetist

## 2013-05-26 ENCOUNTER — Encounter (HOSPITAL_COMMUNITY)
Admission: RE | Disposition: A | Discharge status: Home / Self Care | Payer: Self-pay | Source: Ambulatory Visit | Attending: Cardiology

## 2013-05-26 ENCOUNTER — Encounter (HOSPITAL_COMMUNITY): Payer: Self-pay

## 2013-05-26 DIAGNOSIS — I4891 Unspecified atrial fibrillation: Secondary | ICD-10-CM | POA: Insufficient documentation

## 2013-05-26 DIAGNOSIS — E119 Type 2 diabetes mellitus without complications: Secondary | ICD-10-CM | POA: Insufficient documentation

## 2013-05-26 DIAGNOSIS — I10 Essential (primary) hypertension: Secondary | ICD-10-CM | POA: Insufficient documentation

## 2013-05-26 DIAGNOSIS — G4730 Sleep apnea, unspecified: Secondary | ICD-10-CM | POA: Insufficient documentation

## 2013-05-26 DIAGNOSIS — I2510 Atherosclerotic heart disease of native coronary artery without angina pectoris: Secondary | ICD-10-CM | POA: Insufficient documentation

## 2013-05-26 DIAGNOSIS — Z87891 Personal history of nicotine dependence: Secondary | ICD-10-CM | POA: Insufficient documentation

## 2013-05-26 DIAGNOSIS — K219 Gastro-esophageal reflux disease without esophagitis: Secondary | ICD-10-CM | POA: Insufficient documentation

## 2013-05-26 DIAGNOSIS — I509 Heart failure, unspecified: Secondary | ICD-10-CM | POA: Insufficient documentation

## 2013-05-26 HISTORY — PX: TEE WITHOUT CARDIOVERSION: SHX5443

## 2013-05-26 HISTORY — PX: CARDIOVERSION: SHX1299

## 2013-05-26 LAB — POCT I-STAT 4, (NA,K, GLUC, HGB,HCT)
Glucose, Bld: 155 mg/dL — ABNORMAL HIGH (ref 70–99)
HEMATOCRIT: 47 % (ref 39.0–52.0)
Hemoglobin: 16 g/dL (ref 13.0–17.0)
Potassium: 4.7 mEq/L (ref 3.7–5.3)
Sodium: 137 mEq/L (ref 137–147)

## 2013-05-26 SURGERY — ECHOCARDIOGRAM, TRANSESOPHAGEAL
Anesthesia: Moderate Sedation

## 2013-05-26 MED ORDER — PROPOFOL 10 MG/ML IV BOLUS
INTRAVENOUS | Status: DC | PRN
  Administered 2013-05-26: 70 mg via INTRAVENOUS

## 2013-05-26 MED ORDER — SODIUM CHLORIDE 0.9 % IV SOLN: INTRAVENOUS | Status: DC

## 2013-05-26 MED ORDER — APIXABAN 5 MG PO TABS
5.0000 mg | ORAL_TABLET | Freq: Once | ORAL | Status: AC
  Administered 2013-05-26: 5 mg via ORAL
  Filled 2013-05-26: qty 1

## 2013-05-26 MED ORDER — SILVER SULFADIAZINE 1 % EX CREA: TOPICAL_CREAM | Freq: Two times a day (BID) | CUTANEOUS | Status: DC

## 2013-05-26 MED ORDER — LIDOCAINE HCL 2 % EX GEL
CUTANEOUS | Status: DC | PRN
  Administered 2013-05-26: 80 via TOPICAL

## 2013-05-26 MED ORDER — BUTAMBEN-TETRACAINE-BENZOCAINE 2-2-14 % EX AERO
INHALATION_SPRAY | CUTANEOUS | Status: DC | PRN
  Administered 2013-05-26: 2 via TOPICAL

## 2013-05-26 MED ORDER — MIDAZOLAM HCL 10 MG/2ML IJ SOLN
INTRAMUSCULAR | Status: DC | PRN
  Administered 2013-05-26 (×2): 2 mg via INTRAVENOUS

## 2013-05-26 MED ORDER — MIDAZOLAM HCL 5 MG/ML IJ SOLN
INTRAMUSCULAR | Status: AC
  Filled 2013-05-26: qty 2

## 2013-05-26 MED ORDER — FENTANYL CITRATE 0.05 MG/ML IJ SOLN
INTRAMUSCULAR | Status: AC
  Filled 2013-05-26: qty 2

## 2013-05-26 MED ORDER — SODIUM CHLORIDE 0.9 % IV SOLN
INTRAVENOUS | Status: DC | PRN
  Administered 2013-05-26: 09:00:00 via INTRAVENOUS

## 2013-05-26 MED ORDER — FENTANYL CITRATE 0.05 MG/ML IJ SOLN
INTRAMUSCULAR | Status: DC | PRN
  Administered 2013-05-26: 50 ug via INTRAVENOUS
  Administered 2013-05-26: 25 ug via INTRAVENOUS

## 2013-05-26 NOTE — Anesthesia Postprocedure Evaluation (Signed)
  Anesthesia Post-op Note  Patient: Edgar Hogan  Procedure(s) Performed: Procedure(s): TRANSESOPHAGEAL ECHOCARDIOGRAM (TEE) (N/A) CARDIOVERSION (N/A)  Patient Location: Endoscopy Unit  Anesthesia Type:General  Level of Consciousness: awake, alert , oriented and patient cooperative  Airway and Oxygen Therapy: Patient Spontanous Breathing and Patient connected to nasal cannula oxygen  Post-op Pain: none  Post-op Assessment: Post-op Vital signs reviewed, Patient's Cardiovascular Status Stable, Respiratory Function Stable, Patent Airway, No signs of Nausea or vomiting and Pain level controlled  Post-op Vital Signs: Reviewed and stable  Complications: No apparent anesthesia complications

## 2013-05-26 NOTE — Transfer of Care (Addendum)
Immediate Anesthesia Transfer of Care Note  Patient: Edgar Hogan  Procedure(s) Performed: Procedure(s): TRANSESOPHAGEAL ECHOCARDIOGRAM (TEE) (N/A) CARDIOVERSION (N/A)  Patient Location: Endoscopy  Anesthesia Type:General  Level of Consciousness: awake, alert , oriented and patient cooperative  Airway & Oxygen Therapy: Patient spontaneously breathing, connected to [place] O2  Post-op Assessment: Report given to PACU RN, Post -op Vital signs reviewed and stable and Patient moving all extremities X 4  Post vital signs: Reviewed and stable  Complications: No apparent anesthesia complications

## 2013-05-26 NOTE — Discharge Instructions (Signed)
Monitored Anesthesia Care  °Monitored anesthesia care is an anesthesia service for a medical procedure. Anesthesia is the loss of the ability to feel pain. It is produced by medications called anesthetics. It may affect a small area of your body (local anesthesia), a large area of your body (regional anesthesia), or your entire body (general anesthesia). The need for monitored anesthesia care depends your procedure, your condition, and the potential need for regional or general anesthesia. It is often provided during procedures where:  °· General anesthesia may be needed if there are complications. This is because you need special care when you are under general anesthesia.   °· You will be under local or regional anesthesia. This is so that you are able to have higher levels of anesthesia if needed.   °· You will receive calming medications (sedatives). This is especially the case if sedatives are given to put you in a semi-conscious state of relaxation (deep sedation). This is because the amount of sedative needed to produce this state can be hard to predict. Too much of a sedative can produce general anesthesia. °Monitored anesthesia care is performed by one or more caregivers who have special training in all types of anesthesia. You will need to meet with these caregivers before your procedure. During this meeting, they will ask you about your medical history. They will also give you instructions to follow. (For example, you will need to stop eating and drinking before your procedure. You may also need to stop or change medications you are taking.) During your procedure, your caregivers will stay with you. They will:  °· Watch your condition. This includes watching you blood pressure, breathing, and level of pain.   °· Diagnose and treat problems that occur.   °· Give medications if they are needed. These may include calming medications (sedatives) and anesthetics.   °· Make sure you are comfortable.   °Having  monitored anesthesia care does not necessarily mean that you will be under anesthesia. It does mean that your caregivers will be able to manage anesthesia if you need it or if it occurs. It also means that you will be able to have a different type of anesthesia than you are having if you need it. When your procedure is complete, your caregivers will continue to watch your condition. They will make sure any medications wear off before you are allowed to go home.  °Document Released: 11/19/2004 Document Revised: 06/20/2012 Document Reviewed: 04/06/2012 °Exit[employer]® Patient Information ©2014 [employer]. ° °Electrical Cardioversion, Care After °Refer to this sheet in the next few weeks. These instructions provide you with information on caring for yourself after your procedure. Your health care provider may also give you more specific instructions. Your treatment has been planned according to current medical practices, but problems sometimes occur. Call your health care provider if you have any problems or questions after your procedure. °WHAT TO EXPECT AFTER THE PROCEDURE °After your procedure, it is typical to have the following sensations: °· Some redness on the skin where the shocks were delivered. If this is tender, a sunburn lotion or hydrocortisone cream may help. °· Possible return of an abnormal heart rhythm within hours or days after the procedure. °HOME CARE INSTRUCTIONS °· Only take medicine as directed by your health care provider. Be sure you understand how and when to take your medicine. °· Learn how to feel your pulse and check it often. °· Limit your activity for 48 hours after the procedure or as directed. °· Avoid or minimize caffeine and other   as directed. SEEK MEDICAL CARE IF:  You feel like your heart is beating too fast or your pulse is not regular.  You have any questions about your medicines.  You have bleeding that will not stop. SEEK IMMEDIATE MEDICAL CARE IF:  You are  dizzy or feel faint.  It is hard to breathe or you feel short of breath.  There is a change in discomfort in your chest.  Your speech is slurred or you have trouble moving an arm or leg on one side of your body.  You get a serious muscle cramp that does not go away.  Your fingers or toes turn cold or blue. MAKE SURE YOU:   Understand these instructions.   Will watch your condition.   Will get help right away if you are not doing well or get worse. Document Released: 12/14/2012 Document Reviewed: 09/07/2012 [facility] Patient Information 2014 [place], [place].

## 2013-05-26 NOTE — Preoperative (Signed)
Beta Blockers   Reason not to administer Beta Blockers:Not Applicable 

## 2013-05-26 NOTE — Anesthesia Preprocedure Evaluation (Addendum)
Anesthesia Evaluation  Patient identified by MRN, date of birth, ID band Patient awake    Reviewed: Allergy & Precautions, H&P , NPO status , Patient's Chart, lab work & pertinent test results, reviewed documented beta blocker date and time   History of Anesthesia Complications Negative for: history of anesthetic complications  Airway       Dental   Pulmonary sleep apnea , former smoker,          Cardiovascular hypertension, Pt. on medications and Pt. on home beta blockers + CAD and +CHF  3.20.15 TEE-- EF 40%    Neuro/Psych    GI/Hepatic Neg liver ROS, GERD-  Medicated and Controlled,  Endo/Other  diabetes, Type 2, Oral Hypoglycemic Agents  Renal/GU negative Renal ROS     Musculoskeletal negative musculoskeletal ROS (+)   Abdominal   Peds  Hematology negative hematology ROS (+)   Anesthesia Other Findings   Reproductive/Obstetrics negative OB ROS                        Anesthesia Physical Anesthesia Plan  ASA: III  Anesthesia Plan:    Post-op Pain Management:    Induction: Intravenous  Airway Management Planned: Mask  Additional Equipment:   Intra-op Plan:   Post-operative Plan:   Informed Consent: I have reviewed the patients History and Physical, chart, labs and discussed the procedure including the risks, benefits and alternatives for the proposed anesthesia with the patient or authorized representative who has indicated his/her understanding and acceptance.     Plan Discussed with: CRNA and Anesthesiologist  Anesthesia Plan Comments:         Anesthesia Quick Evaluation

## 2013-05-26 NOTE — OR Nursing (Signed)
Patient cardioverted three times. Sustained a small linear inch size burn area on chest

## 2013-05-26 NOTE — Procedures (Addendum)
Electrical Cardioversion Procedure Note Rilyn Upshaw [id number] 02/03/1946  Procedure: Electrical Cardioversion Indications:  Atrial Fibrillation.  TEE showed no LAA thrombus.  He has been on Eliquis without missing a dose for over 1 week.   Procedure Details Consent: Risks of procedure as well as the alternatives and risks of each were explained to the (patient/caregiver).  Consent for procedure obtained. Time Out: Verified patient identification, verified procedure, site/side was marked, verified correct patient position, special equipment/implants available, medications/allergies/relevent history reviewed, required imaging and test results available.  Performed  Patient placed on cardiac monitor, pulse oximetry, supplemental oxygen as necessary.  Sedation given: Propofol per anesthesiology. Pacer pads placed anterior and posterior chest.  Cardioverted 3 time(s).  Patient converted to NSR with the 3rd cardioversion using sternal pressure.  Cardioverted at [place].  Evaluation Findings: Post procedure EKG shows: NSR Complications: Minor burn to chest wall from cardioversion.  Patient did tolerate procedure well.   If atrial fibrillation recurs, will have him see Dr. Rayann Heman for consideration of atrial fibrillation ablation.   Needs followup with me in 2 wks with BMET.   [name] 05/26/2013, 10:52 AM

## 2013-05-26 NOTE — Progress Notes (Signed)
*  PRELIMINARY RESULTS* Echocardiogram Echocardiogram Transesophageal has been performed.  Edgar Hogan 05/26/2013, 11:00 AM

## 2013-05-26 NOTE — CV Procedure (Signed)
Procedure: TEE  Indication: Atrial fibrillation, pre-cardioversion.  Patient has been on Eliquis without missing a dose for about 1 week now.   Sedation: Versed 4 mg IV, Fentanyl 75 mcg IV  Findings: Normal LV size with global hypokinesis, EF 40-45%.  Normal RV size and systolic function.  Trivial MR. Mild to moderate biatrial enlargement with no LAA thrombus.    OK to proceed to DCCV.    No complications.   [name] 05/26/2013 10:51 AM

## 2013-05-29 ENCOUNTER — Encounter (HOSPITAL_COMMUNITY): Payer: Self-pay | Admitting: Cardiology

## 2013-05-30 ENCOUNTER — Other Ambulatory Visit: Payer: Self-pay

## 2013-05-30 ENCOUNTER — Telehealth: Payer: Self-pay

## 2013-05-30 NOTE — Telephone Encounter (Signed)
Yes, I increased it to twice daily when he had TEE-cardioversion.  Can make that change in his med list and call in for him.

## 2013-06-01 ENCOUNTER — Other Ambulatory Visit: Payer: Self-pay | Admitting: *Deleted

## 2013-06-01 DIAGNOSIS — R079 Chest pain, unspecified: Secondary | ICD-10-CM

## 2013-06-01 DIAGNOSIS — I509 Heart failure, unspecified: Secondary | ICD-10-CM

## 2013-06-01 MED ORDER — METOPROLOL SUCCINATE ER 25 MG PO TB24: 25.0000 mg | ORAL_TABLET | Freq: Two times a day (BID) | ORAL | Status: DC

## 2013-06-09 DIAGNOSIS — F329 Major depressive disorder, single episode, unspecified: Secondary | ICD-10-CM | POA: Insufficient documentation

## 2013-06-09 DIAGNOSIS — Z8739 Personal history of other diseases of the musculoskeletal system and connective tissue: Secondary | ICD-10-CM | POA: Insufficient documentation

## 2013-06-09 DIAGNOSIS — F32A Depression, unspecified: Secondary | ICD-10-CM | POA: Insufficient documentation

## 2013-06-09 DIAGNOSIS — I10 Essential (primary) hypertension: Secondary | ICD-10-CM | POA: Insufficient documentation

## 2013-06-09 DIAGNOSIS — K219 Gastro-esophageal reflux disease without esophagitis: Secondary | ICD-10-CM | POA: Insufficient documentation

## 2013-06-09 DIAGNOSIS — G4700 Insomnia, unspecified: Secondary | ICD-10-CM | POA: Insufficient documentation

## 2013-06-14 ENCOUNTER — Encounter: Payer: Self-pay | Admitting: Cardiology

## 2013-06-14 ENCOUNTER — Ambulatory Visit (INDEPENDENT_AMBULATORY_CARE_PROVIDER_SITE_OTHER): Payer: 59 | Admitting: Cardiology

## 2013-06-14 VITALS — BP 116/77 | HR 77 | Ht 74.0 in | Wt 244.0 lb

## 2013-06-14 DIAGNOSIS — I4891 Unspecified atrial fibrillation: Secondary | ICD-10-CM

## 2013-06-14 DIAGNOSIS — I5032 Chronic diastolic (congestive) heart failure: Secondary | ICD-10-CM

## 2013-06-14 DIAGNOSIS — I509 Heart failure, unspecified: Secondary | ICD-10-CM

## 2013-06-14 DIAGNOSIS — I2510 Atherosclerotic heart disease of native coronary artery without angina pectoris: Secondary | ICD-10-CM

## 2013-06-14 DIAGNOSIS — R079 Chest pain, unspecified: Secondary | ICD-10-CM

## 2013-06-14 MED ORDER — METOPROLOL SUCCINATE ER 25 MG PO TB24: 25.0000 mg | ORAL_TABLET | Freq: Two times a day (BID) | ORAL | Status: DC

## 2013-06-14 NOTE — Progress Notes (Signed)
Patient ID: Edgar Hogan, male   DOB: 18-Aug-1945, 68 y.o.   MRN: [id number] PCP: Dr. Melford Aase  68 yo with history of HTN, type II diabetes, OSA, and recently identified atrial fibrillation presents for cardiology followup today. Patient had an CXR done not long ago with concern for thoracic aortic aneurysm. He is claustrophobic so came to [facility] on 3/5 for general anesthesia to get CTA chest done. Initially, anesthesia flow sheets say he was in NSR. However, at the end of the procedure, he was noted to be in atrial fibrillation. ECG confirmed atrial fibrillation with controlled rate. The CTA showed no aortic aneurysm but there was evidence for mild pulmonary edema and there was coronary calcification.  I saw him in the hospital and started him on a low dose of Toprol XL as well as Eliquis 5 mg bid. I had him do an echocardiogram in 3/15 which showed EF 40-45% with diffuse hypokinesis.  He had volume overload so I started him on Lasix.   In 3/15, I did a TEE-guided cardioversion with successful conversion to NSR.  TEE confirmed EF about 40-45%.  LHC showed EF 45% with nonobstructive coronary disease. Since last appointment, patient feels like he has been having periodic runs of atrial fibrillation.  This will occur every 2-3 days.  His pulse will race for several hours then resolve.  When his pulse races, he gets fatigued and feels pressure in his chest.  At other times, he is doing ok without significant exertional dyspnea or chest pain.  Weight is down 3 lbs. He says that he saw his PCP a week or 2 ago and was told that his heart sounded like it was back in atrial fibrillation but he did not have an ECG.  He is in NSR today.   Labs (3/15): K 4.3, creatinine 0.8 =>0.9, BNP 549, TSH normal, HCT 40.5  ECG: NSR, normal  Review of systems complete and found to be negative unless listed above in HPI    Past Medical History:  1. HTN  2. Type II diabetes  3. Gout  4. Hyperlipidemia  5. OSA: Not on CPAP  (does not tolerate).   6. Atrial fibrillation: 1st diagnosed 3/15. Echo (3/15) with EF 40-45%, diffuse hypokinesis, moderate LVH, moderate MR, moderate LAE, moderately dilated RV with normal RV systolic function. DCCV to NSR in 3/15.  7. CAD: LHC 2007 with luminal irregularities. CTA chest (3/15) with coronary artery calcifications. LHC (3/15) with EF 45%, nonobstructive CAD.  8. Cardiomyopathy: Nonischemic, ?tachycardia-mediated.  Echo (3/15) with EF 40-45%, diffuse hypokinesis, moderate LVH, moderate MR, moderate LAE, moderately dilated RV with normal RV systolic function.  TEE (3/15) with EF 40-45%, diffuse hypokinesis, normal RV.   SH: Prior smoker, quit 1983. Married, wife is dermatopathologist. Has children.  Retired.    FH: CAD    Current Outpatient Prescriptions  Medication Sig Dispense Refill  . allopurinol (ZYLOPRIM) 300 MG tablet Take 300 mg by mouth daily.      Marland Kitchen apixaban (ELIQUIS) 5 MG TABS tablet Take 1 tablet (5 mg total) by mouth 2 (two) times daily.      Marland Kitchen atorvastatin (LIPITOR) 40 MG tablet Take 40 mg by mouth daily.      . furosemide (LASIX) 40 MG tablet Take 40 mg by mouth daily.      Marland Kitchen lisinopril (PRINIVIL,ZESTRIL) 40 MG tablet Take 40 mg by mouth daily.      . metFORMIN (GLUCOPHAGE) 500 MG tablet Take 500 mg by mouth 2 (two)  times daily with a meal.      . metoprolol succinate (TOPROL XL) 25 MG 24 hr tablet Take 1 tablet (25 mg total) by mouth 2 (two) times daily.  60 tablet  6  . Multiple Vitamins-Minerals (MULTIVITAMIN PO) Take 1 tablet by mouth daily.      . potassium chloride SA (K-DUR,KLOR-CON) 20 MEQ tablet Take 20 mEq by mouth daily.      . temazepam (RESTORIL) 30 MG capsule Take 30 mg by mouth at bedtime.      . traZODone (DESYREL) 150 MG tablet Take 150 mg by mouth at bedtime.      . Vortioxetine HBr (BRINTELLIX) 10 MG TABS Take 10 mg by mouth daily.      Marland Kitchen omeprazole (PRILOSEC) 40 MG capsule Take 40 mg by mouth 2 (two) times daily.       No current  facility-administered medications for this visit.    BP 116/77  Pulse 77  Ht 6\' 2"  (1.88 m)  Wt 110.678 kg (244 lb)  BMI 31.31 kg/m2 General: NAD Neck: Thick, JVP 7 cm, no thyromegaly or thyroid nodule.  Lungs: Clear to auscultation bilaterally with normal respiratory effort. CV: Nondisplaced PMI.  Heart regular S1/S2, no S3/S4, no murmur.  No peripheral edema.  No carotid bruit.  Normal pedal pulses.  Abdomen: Soft, nontender, no hepatosplenomegaly, mild distention.  Skin: Intact without lesions or rashes.  Neurologic: Alert and oriented x 3.  Psych: Normal affect. Extremities: No clubbing or cyanosis.   Assessment/Plan:  1. Abnormal CXR: Patient does not have a thoracic aortic aneurysm by CTA.  2. Atrial fibrillation: Patient is in NSR today after DCCV in 3/15 but he feels like he goes in and out of symptomatic atrial fibrillation.  He does not like the feeing of atrial fibrillation.  Recurrent atrial fibrillation has not been objectively documented, but his PCP did tell him he was in atrial fibrillation a couple of weeks ago. Risk factors for CVA include OSA, HTN, diabetes, CHF, and age>65. CHADSVASC = 5.   I do not think that he tolerates atrial fibrillation very well.  - Continue Eliquis and Toprol XL. - I would like to keep him out of atrial fibrillation given symptoms.  I am going to place a 2 week event monitor to see if the symptoms that he feels are indeed recurrent atrial fibrillation.  If so, will need to decide on treatment.  He is not a class Ic candidate with structural heart disease (low EF) and not a dronedarone candidate given CHF.  Would avoid amiodarone.  Tikosyn or sotalol would probably be the best options and would require initiation in the hospital.  However, he is interested in atrial fibrillation ablation for more definitive treatment. I am going to refer him to see Dr. Rayann Heman.  3. Chronic systolic CHF: Nonischemic cardiomyopathy.  Volume status better, NYHA class II  symptoms.  EF 40-45% on echo. No significant coronary disease on LHC. Cardiomyopathy of uncertain etiology: atrial fibrillation has not been rapid even when he was not on Toprol XL, so this seems less likely due to be tachy-mediated cardiomyopathy.    - Continue Toprol XL, lisinopril, and Lasix at current doses.  4. CAD: Nonobstructive CAD on cath.  Continue statin.   5. OSA: Does not tolerate CPAP.   Larey Dresser 06/14/2013

## 2013-06-14 NOTE — Patient Instructions (Signed)
You have been referred to Dr Thompson Grayer for evaluation of atrial fibrillation ablation.   Your physician has recommended that you wear an event monitor. Event monitors are medical devices that record the heart's electrical activity. Doctors most often [place] these monitors to diagnose arrhythmias. Arrhythmias are problems with the speed or rhythm of the heartbeat. The monitor is a small, portable device. You can wear one while you do your normal daily activities. This is usually used to diagnose what is causing palpitations/syncope (passing out). [place] recommends that you schedule a follow-up appointment in: 3 months with Dr Aundra Dubin. (July 2015).

## 2013-06-19 NOTE — Anesthesia Postprocedure Evaluation (Signed)
Anesthesia Post Note  Patient: Edgar Hogan  Procedure(s) Performed: Procedure(s) (LRB): RADIOLOGY WITH ANESTHESIA-CT (N/A)  Anesthesia type: general  Patient location: PACU  Post pain: Pain level controlled  Post assessment: Patient's Cardiovascular Status Stable  Last Vitals:  Filed Vitals:   05/11/13 1537  BP: 143/97  Pulse: 69  Temp:   Resp: 15    Post vital signs: Reviewed and stable  Level of consciousness: sedated  Complications: No apparent anesthesia complications

## 2013-06-20 ENCOUNTER — Encounter (INDEPENDENT_AMBULATORY_CARE_PROVIDER_SITE_OTHER): Payer: PRIVATE HEALTH INSURANCE

## 2013-06-20 ENCOUNTER — Encounter: Payer: Self-pay | Admitting: Radiology

## 2013-06-20 DIAGNOSIS — I4891 Unspecified atrial fibrillation: Secondary | ICD-10-CM

## 2013-06-20 DIAGNOSIS — I509 Heart failure, unspecified: Secondary | ICD-10-CM

## 2013-06-20 DIAGNOSIS — R079 Chest pain, unspecified: Secondary | ICD-10-CM

## 2013-06-20 NOTE — Progress Notes (Signed)
Patient ID: Edgar Hogan, male   DOB: 01-02-1946, 68 y.o.   MRN: [id number] 14 day lifewatch monitor applied

## 2013-07-12 ENCOUNTER — Ambulatory Visit (INDEPENDENT_AMBULATORY_CARE_PROVIDER_SITE_OTHER): Payer: 59 | Admitting: Internal Medicine

## 2013-07-12 ENCOUNTER — Telehealth: Payer: Self-pay | Admitting: Internal Medicine

## 2013-07-12 ENCOUNTER — Encounter: Payer: Self-pay | Admitting: Internal Medicine

## 2013-07-12 VITALS — BP 133/99 | HR 75 | Ht 74.0 in | Wt 248.0 lb

## 2013-07-12 DIAGNOSIS — I5032 Chronic diastolic (congestive) heart failure: Secondary | ICD-10-CM

## 2013-07-12 DIAGNOSIS — I493 Ventricular premature depolarization: Secondary | ICD-10-CM

## 2013-07-12 DIAGNOSIS — G4733 Obstructive sleep apnea (adult) (pediatric): Secondary | ICD-10-CM

## 2013-07-12 DIAGNOSIS — I4891 Unspecified atrial fibrillation: Secondary | ICD-10-CM

## 2013-07-12 DIAGNOSIS — I4949 Other premature depolarization: Secondary | ICD-10-CM

## 2013-07-12 DIAGNOSIS — R079 Chest pain, unspecified: Secondary | ICD-10-CM

## 2013-07-12 DIAGNOSIS — I509 Heart failure, unspecified: Secondary | ICD-10-CM

## 2013-07-12 MED ORDER — METOPROLOL SUCCINATE ER 50 MG PO TB24: 50.0000 mg | ORAL_TABLET | Freq: Two times a day (BID) | ORAL | Status: AC

## 2013-07-12 NOTE — Telephone Encounter (Signed)
Spoke with wife and answered her questions to the best of my ability.  She  is an MD and had lots of questions.  I let her know if the biggest problem she is concerned with is his cramps in his legs with exercise(which was not mentioned) at his office visit today of in April with Dr Aundra Dubin then he would need to call and discuss moving appointment up.  She says this all just started after his 4/8 visit.  Also let her know she should try and come to huis visits with him if there are "issues with him retaining and or blocking out what he doesn't want to hear."  She verbalized understanding and was appreciative of the call

## 2013-07-12 NOTE — Progress Notes (Signed)
Primary Care Physician: Chesley Noon, MD Referring Physician:  Dr Debbra Riding Rawe is a 68 y.o. male with a h/o recently diagnosed atrial fibrillation and PVCs who presents for EP consultation.  He reports having palpitations for quite some time.  05/11/13 he presented for anesthesia to have a CT of his aorta obtained.  He was found to have afib during the procedure.  He has severe sleep apnea for which he does not tolerate CPAP due to claustrophobia.  He required cardioversion from afib.  He has been placed on eliquis and metoprolol.  Echo reveals EF 45%.  He continued to have brief intermittent palpitations for which he wore an event monitor 4/15.  This revealed sinus with PVCs but no afib.    Today, he denies symptoms of  chest pain, shortness of breath, orthopnea, PND, lower extremity edema, dizziness, presyncope, syncope, or neurologic sequela. The patient is tolerating medications without difficulties and is otherwise without complaint today.   Past Medical History  Diagnosis Date  . Atrial fibrillation   . Type II or unspecified type diabetes mellitus without mention of complication, not stated as uncontrolled   . HLD (hyperlipidemia)   . Unspecified essential hypertension   . OSA (obstructive sleep apnea)   . Gout   . CHF (congestive heart failure)    Past Surgical History  Procedure Laterality Date  . Radiology with anesthesia N/A 05/11/2013    Procedure: RADIOLOGY WITH ANESTHESIA-CT;  Surgeon: Medication Radiologist, MD;  Location: [place] [facility];  Service: Radiology;  Laterality: N/A;  . Tee without cardioversion N/A 05/26/2013    Procedure: TRANSESOPHAGEAL ECHOCARDIOGRAM (TEE);  Surgeon: Larey Dresser, MD;  Location: [place];  Service: Cardiovascular;  Laterality: N/A;  . Cardioversion N/A 05/26/2013    Procedure: CARDIOVERSION;  Surgeon: Larey Dresser, MD;  Location: [facility];  Service: Cardiovascular;  Laterality: N/A;    Current Outpatient Prescriptions    Medication Sig Dispense Refill  . allopurinol (ZYLOPRIM) 300 MG tablet Take 300 mg by mouth daily.      Marland Kitchen apixaban (ELIQUIS) 5 MG TABS tablet Take 1 tablet (5 mg total) by mouth 2 (two) times daily.      Marland Kitchen atorvastatin (LIPITOR) 40 MG tablet Take 40 mg by mouth daily.      . furosemide (LASIX) 40 MG tablet Take 40 mg by mouth daily.      Marland Kitchen lisinopril (PRINIVIL,ZESTRIL) 40 MG tablet Take 40 mg by mouth daily.      . metFORMIN (GLUCOPHAGE) 500 MG tablet Take 500 mg by mouth 2 (two) times daily with a meal.      . metoprolol succinate (TOPROL XL) 50 MG 24 hr tablet Take 1 tablet (50 mg total) by mouth 2 (two) times daily.  180 tablet  3  . Multiple Vitamins-Minerals (MULTIVITAMIN PO) Take 1 tablet by mouth daily.      Marland Kitchen omeprazole (PRILOSEC) 40 MG capsule Take 40 mg by mouth 2 (two) times daily.      . potassium chloride SA (K-DUR,KLOR-CON) 20 MEQ tablet Take 20 mEq by mouth daily.      . temazepam (RESTORIL) 30 MG capsule Take 30 mg by mouth at bedtime.      . traZODone (DESYREL) 150 MG tablet Take 150 mg by mouth at bedtime.      . Vortioxetine HBr (BRINTELLIX) 10 MG TABS Take 10 mg by mouth daily.       No current facility-administered medications for this visit.    No Known Allergies  History   Social History  . Marital Status: Married    Spouse Name: N/A    Number of Children: N/A  . Years of Education: N/A   Occupational History  . Not on file.   Social History Main Topics  . Smoking status: Former Research scientist (life sciences)  . Smokeless tobacco: Not on file     Comment: quit 1983  . Alcohol Use: No  . Drug Use: No  . Sexual Activity: Not on file   Other Topics Concern  . Not on file   Social History Narrative  . No narrative on file    Family History  Problem Relation Age of Onset  . CAD      family history    ROS- All systems are reviewed and negative except as per the HPI above  Physical Exam: Filed Vitals:   07/12/13 1217  BP: 133/99  Pulse: 75  Height: 6\' 2"  (1.88 m)   Weight: 248 lb (112.492 kg)    GEN- The patient is well appearing, alert and oriented x 3 today.   Head- normocephalic, atraumatic Eyes-  Sclera clear, conjunctiva pink Ears- hearing intact Oropharynx- clear Neck- supple, no JVP Lungs- Clear to ausculation bilaterally, normal work of breathing Heart- Regular rate and rhythm with frequent ectopy, no murmurs, rubs or gallops, PMI not laterally displaced GI- soft, NT, ND, + BS Extremities- no clubbing, cyanosis, or edema MS- no significant deformity or atrophy Skin- no rash or lesion Psych- euthymic mood, full affect Neuro- strength and sensation are intact  EKG today reveals sinus rhythm with PVCs, QTc 399 Echo is reviewed Event monitor is reviewed Epic notes are reviewed including Dr Oleh Genin records  Assessment and Plan:   1. Atrial fibrillation He has only had a single episode of documented afib.  He has frequent palpitations which appear to be PVCs.  At this time, I think that we should continue to watch clinically.  If he has further afib then I would favor AAD therapy with either sotalol or tikosyn.  Continue eliquis for chads2vasc score of at least 3. I would advise medical therapy rather than proceeding directly to ablation given that he has both afib and PVCs.  I addition, he has untreated sleep apnea which substantially reduces success rates with ablation long term.  2. pvcs Increase toprol to 50mg  bid  3. OSA He declines referral to Dr Elsworth Soho or to ENT stating that he has been seen at [facility] and also at [facility] and does not think that they would have much to add.  4. Obesity Weight loss advised  5. Nonischemic CM Continue medical therpay  Follow-up with Dr Aundra Dubin I will see as needed going forward

## 2013-07-12 NOTE — Telephone Encounter (Signed)
Patient came in today. He didn't understand what was discussed in his visit. Please call wife and explain to her, please call and advise.

## 2013-07-12 NOTE — Patient Instructions (Addendum)
Your physician recommends that you schedule a follow-up appointment as needed with Dr Rayann Heman and Dr Aundra Dubin in July  Your physician has recommended you make the following change in your medication:  1) Increase Toprol 50mg  twice daily   Leonia Reader [phone]

## 2013-08-26 ENCOUNTER — Other Ambulatory Visit: Payer: Self-pay | Admitting: Cardiology

## 2013-10-25 ENCOUNTER — Encounter: Payer: Self-pay | Admitting: Cardiology

## 2013-10-25 ENCOUNTER — Encounter: Payer: Self-pay | Admitting: *Deleted

## 2013-10-25 ENCOUNTER — Ambulatory Visit (INDEPENDENT_AMBULATORY_CARE_PROVIDER_SITE_OTHER): Payer: BC Managed Care – PPO | Admitting: Cardiology

## 2013-10-25 VITALS — BP 100/70 | HR 74 | Ht 74.0 in | Wt 248.0 lb

## 2013-10-25 DIAGNOSIS — I4949 Other premature depolarization: Secondary | ICD-10-CM

## 2013-10-25 DIAGNOSIS — I509 Heart failure, unspecified: Secondary | ICD-10-CM

## 2013-10-25 DIAGNOSIS — I10 Essential (primary) hypertension: Secondary | ICD-10-CM

## 2013-10-25 DIAGNOSIS — I2510 Atherosclerotic heart disease of native coronary artery without angina pectoris: Secondary | ICD-10-CM

## 2013-10-25 DIAGNOSIS — I5032 Chronic diastolic (congestive) heart failure: Secondary | ICD-10-CM

## 2013-10-25 DIAGNOSIS — I5022 Chronic systolic (congestive) heart failure: Secondary | ICD-10-CM

## 2013-10-25 DIAGNOSIS — I493 Ventricular premature depolarization: Secondary | ICD-10-CM

## 2013-10-25 DIAGNOSIS — I4891 Unspecified atrial fibrillation: Secondary | ICD-10-CM

## 2013-10-25 DIAGNOSIS — I480 Paroxysmal atrial fibrillation: Secondary | ICD-10-CM

## 2013-10-25 MED ORDER — LISINOPRIL 20 MG PO TABS: 20.0000 mg | ORAL_TABLET | Freq: Every day | ORAL | Status: DC

## 2013-10-25 NOTE — Patient Instructions (Addendum)
Decrease lisinopril to 20mg  daily. You can take 1/2 of your 40mg  tablet daily and use your current supply.   Your physician has requested that you have an echocardiogram. Echocardiography is a painless test that uses sound waves to create images of your heart. It provides your doctor with information about the size and shape of your heart and how well your heart's chambers and valves are working. This procedure takes approximately one hour. There are no restrictions for this procedure. September 2015  Your physician recommends that you schedule a follow-up appointment in: 3 months with Dr Aundra Dubin.

## 2013-10-26 DIAGNOSIS — I5022 Chronic systolic (congestive) heart failure: Secondary | ICD-10-CM | POA: Insufficient documentation

## 2013-10-26 NOTE — Progress Notes (Signed)
Patient ID: Edgar Hogan, male   DOB: 03/28/1945, 68 y.o.   MRN: [id number] PCP: Dr. Melford Aase  68 yo with history of HTN, type II diabetes, OSA, and recently identified atrial fibrillation presents for cardiology followup today. Patient had an CXR done not long ago with concern for thoracic aortic aneurysm. He is claustrophobic so came to [facility] on 3/5 for general anesthesia to get CTA chest done. Initially, anesthesia flow sheets say he was in NSR. However, at the end of the procedure, he was noted to be in atrial fibrillation. ECG confirmed atrial fibrillation with controlled rate. The CTA showed no aortic aneurysm but there was evidence for mild pulmonary edema and there was coronary calcification.  I saw him in the hospital and started him on a low dose of Toprol XL as well as Eliquis 5 mg bid. I had him do an echocardiogram in 3/15 which showed EF 40-45% with diffuse hypokinesis.  He had volume overload so I started him on Lasix.   In 3/15, I did a TEE-guided cardioversion with successful conversion to NSR.  TEE confirmed EF about 40-45%.  LHC showed EF 45% with nonobstructive coronary disease. After cardioversion, patient continued to feel palpitations.  2 week monitor in 4/15 showed PVCs but no atrial fibrillation.  Currently, he is not having palpitations.  He is golfing 3 days a week.  He has dyspnea walking up hills.  Occasional spells of lightheadedness and feels fatigued overall with little energy.  BP is on the low side today at 100/70.     Labs (3/15): K 4.3, creatinine 0.8 =>0.9, BNP 549, TSH normal, HCT 40.5  Review of systems complete and found to be negative unless listed above in HPI    Past Medical History:  1. HTN  2. Type II diabetes  3. Gout  4. Hyperlipidemia  5. OSA: Not on CPAP (does not tolerate).   6. Atrial fibrillation: 1st diagnosed 3/15. Echo (3/15) with EF 40-45%, diffuse hypokinesis, moderate LVH, moderate MR, moderate LAE, moderately dilated RV with normal RV  systolic function. DCCV to NSR in 3/15.  2 week monitor in 4/15 with PVC, no atrial fibrillation. 7. CAD: LHC 2007 with luminal irregularities. CTA chest (3/15) with coronary artery calcifications. LHC (3/15) with EF 45%, nonobstructive CAD.  8. Cardiomyopathy: Nonischemic, ?tachycardia-mediated.  Echo (3/15) with EF 40-45%, diffuse hypokinesis, moderate LVH, moderate MR, moderate LAE, moderately dilated RV with normal RV systolic function.  TEE (3/15) with EF 40-45%, diffuse hypokinesis, normal RV.   SH: Prior smoker, quit 1983. Married, wife is dermatopathologist. Has children.  Retired.    FH: CAD    Current Outpatient Prescriptions  Medication Sig Dispense Refill  . allopurinol (ZYLOPRIM) 300 MG tablet Take 300 mg by mouth daily.      Marland Kitchen apixaban (ELIQUIS) 5 MG TABS tablet Take 1 tablet (5 mg total) by mouth 2 (two) times daily.      Marland Kitchen atorvastatin (LIPITOR) 40 MG tablet Take 40 mg by mouth daily.      . furosemide (LASIX) 40 MG tablet TAKE 1 TABLET BY MOUTH EVERY DAY  30 tablet  11  . metFORMIN (GLUCOPHAGE) 500 MG tablet Take 500 mg by mouth 2 (two) times daily with a meal.      . metoprolol succinate (TOPROL XL) 50 MG 24 hr tablet Take 1 tablet (50 mg total) by mouth 2 (two) times daily.  180 tablet  3  . Multiple Vitamins-Minerals (MULTIVITAMIN PO) Take 1 tablet by mouth daily.      Marland Kitchen  omeprazole (PRILOSEC) 40 MG capsule Take 40 mg by mouth 2 (two) times daily.      . potassium chloride SA (K-DUR,KLOR-CON) 20 MEQ tablet TAKE 1 TABLET BY MOUTH EVERY DAY  30 tablet  11  . temazepam (RESTORIL) 30 MG capsule Take 30 mg by mouth at bedtime.      . traZODone (DESYREL) 150 MG tablet Take 150 mg by mouth at bedtime.      . Vortioxetine HBr (BRINTELLIX) 10 MG TABS Take 10 mg by mouth daily.      Marland Kitchen lisinopril (PRINIVIL,ZESTRIL) 20 MG tablet Take 1 tablet (20 mg total) by mouth daily.  30 tablet  4   No current facility-administered medications for this visit.    BP 100/70  Pulse 74  Ht 6\' 2"   (1.88 m)  Wt 112.492 kg (248 lb)  BMI 31.83 kg/m2 General: NAD Neck: Thick, JVP 7 cm, no thyromegaly or thyroid nodule.  Lungs: Clear to auscultation bilaterally with normal respiratory effort. CV: Nondisplaced PMI.  Heart regular S1/S2, no S3/S4, no murmur.  No peripheral edema.  No carotid bruit.  Normal pedal pulses.  Abdomen: Soft, nontender, no hepatosplenomegaly, mild distention.  Skin: Intact without lesions or rashes.  Neurologic: Alert and oriented x 3.  Psych: Normal affect. Extremities: No clubbing or cyanosis.   Assessment/Plan:  1. Abnormal CXR: Patient does not have a thoracic aortic aneurysm by CTA.  2. Atrial fibrillation: Patient is in NSR today after DCCV in 3/15.  Recurrent atrial fibrillation has not been documented. Risk factors for CVA include OSA, HTN, diabetes, CHF, and age>66. CHADSVASC = 5.  2 week monitor post-DCCV in 4/15 showed only PVCs.  No significant recent palpitations.  - Continue Eliquis and Toprol XL. - If he has recurrent atrial fibrillation, dofetilide or sotalol would be his best option for antiarrhythmic.  3. Chronic systolic CHF: Nonischemic cardiomyopathy.  Volume status better, NYHA class II symptoms.  He does have a lot of fatigue and BP is on the low side.  EF 40-45% on echo. No significant coronary disease on LHC. Cardiomyopathy of uncertain etiology: atrial fibrillation was not rapid even when he was not on Toprol XL, so this seems less likely due to be tachy-mediated cardiomyopathy.    - Continue Toprol XL and Lasix at current doses.  - Given fatigue and low BP, will decrease lisinopril to 20 mg daily to see if this helps his symptoms. - Repeat echo in 9/15 to reassess EF.  4. CAD: Nonobstructive CAD on cath.  Continue statin.   5. OSA: Does not tolerate CPAP.   [name] 10/26/2013

## 2013-11-09 ENCOUNTER — Other Ambulatory Visit: Payer: Self-pay | Admitting: Cardiology

## 2013-11-20 ENCOUNTER — Other Ambulatory Visit (HOSPITAL_COMMUNITY): Payer: BC Managed Care – PPO

## 2013-12-06 ENCOUNTER — Ambulatory Visit (HOSPITAL_COMMUNITY): Payer: BC Managed Care – PPO | Attending: Cardiovascular Disease | Admitting: Cardiology

## 2013-12-06 DIAGNOSIS — I509 Heart failure, unspecified: Secondary | ICD-10-CM | POA: Diagnosis not present

## 2013-12-06 DIAGNOSIS — I059 Rheumatic mitral valve disease, unspecified: Secondary | ICD-10-CM | POA: Insufficient documentation

## 2013-12-06 DIAGNOSIS — I5032 Chronic diastolic (congestive) heart failure: Secondary | ICD-10-CM | POA: Insufficient documentation

## 2013-12-06 DIAGNOSIS — I4891 Unspecified atrial fibrillation: Secondary | ICD-10-CM | POA: Diagnosis not present

## 2013-12-06 DIAGNOSIS — I2510 Atherosclerotic heart disease of native coronary artery without angina pectoris: Secondary | ICD-10-CM | POA: Insufficient documentation

## 2013-12-06 NOTE — Progress Notes (Signed)
Echo performed. 

## 2014-01-12 ENCOUNTER — Encounter: Payer: Self-pay | Admitting: Cardiology

## 2014-01-12 ENCOUNTER — Ambulatory Visit (INDEPENDENT_AMBULATORY_CARE_PROVIDER_SITE_OTHER): Payer: BC Managed Care – PPO | Admitting: Cardiology

## 2014-01-12 VITALS — BP 122/72 | HR 98 | Ht 74.0 in | Wt 250.0 lb

## 2014-01-12 DIAGNOSIS — R413 Other amnesia: Secondary | ICD-10-CM

## 2014-01-12 DIAGNOSIS — I5032 Chronic diastolic (congestive) heart failure: Secondary | ICD-10-CM

## 2014-01-12 DIAGNOSIS — I480 Paroxysmal atrial fibrillation: Secondary | ICD-10-CM

## 2014-01-12 DIAGNOSIS — I2510 Atherosclerotic heart disease of native coronary artery without angina pectoris: Secondary | ICD-10-CM

## 2014-01-12 DIAGNOSIS — I10 Essential (primary) hypertension: Secondary | ICD-10-CM

## 2014-01-12 LAB — BASIC METABOLIC PANEL
BUN: 27 mg/dL — AB (ref 6–23)
CHLORIDE: 105 meq/L (ref 96–112)
CO2: 27 meq/L (ref 19–32)
CREATININE: 1 mg/dL (ref 0.4–1.5)
Calcium: 9.4 mg/dL (ref 8.4–10.5)
GFR: 76.63 mL/min (ref >60.00)
GLUCOSE: 112 mg/dL — AB (ref 70–99)
POTASSIUM: 4.3 meq/L (ref 3.5–5.1)
Sodium: 139 mEq/L (ref 135–145)

## 2014-01-12 LAB — CBC WITH DIFFERENTIAL/PLATELET
BASOS PCT: 0.4 % (ref 0.0–3.0)
Basophils Absolute: 0.1 10*3/uL (ref 0.0–0.1)
EOS ABS: 0.3 10*3/uL (ref 0.0–0.7)
EOS PCT: 2.5 % (ref 0.0–5.0)
HCT: 42.6 % (ref 39.0–52.0)
Hemoglobin: 13.9 g/dL (ref 13.0–17.0)
LYMPHS PCT: 23.9 % (ref 12.0–46.0)
Lymphs Abs: 3.2 10*3/uL (ref 0.7–4.0)
MCHC: 32.5 g/dL (ref 30.0–36.0)
MCV: 93.3 fl (ref 78.0–100.0)
Monocytes Absolute: 0.9 10*3/uL (ref 0.1–1.0)
Monocytes Relative: 6.3 % (ref 3.0–12.0)
NEUTROS ABS: 9 10*3/uL — AB (ref 1.4–7.7)
Neutrophils Relative %: 66.9 % (ref 43.0–77.0)
Platelets: 229 10*3/uL (ref 150.0–400.0)
RBC: 4.57 Mil/uL (ref 4.22–5.81)
RDW: 13.2 % (ref 11.5–15.5)
WBC: 13.5 10*3/uL — AB (ref 4.0–10.5)

## 2014-01-12 LAB — LDL CHOLESTEROL, DIRECT: Direct LDL: 94.6 mg/dL

## 2014-01-12 LAB — MAGNESIUM: Magnesium: 1.4 mg/dL — ABNORMAL LOW (ref 1.5–2.5)

## 2014-01-12 LAB — LIPID PANEL
CHOL/HDL RATIO: 6
Cholesterol: 192 mg/dL (ref 0–200)
HDL: 33.7 mg/dL — AB (ref >39.00)
NONHDL: 158.3
VLDL: 91.6 mg/dL — ABNORMAL HIGH (ref 0.0–40.0)

## 2014-01-12 NOTE — Patient Instructions (Signed)
Your physician recommends that you have  lab work today--BMET/CBCd/Lipid profile/Magnesium level.   Schedule an appointment in 2 weeks with the pharmacist for [facility] admission.   You have been referred to Dr Tomi Likens at [facility].  Your physician recommends that you schedule a follow-up appointment in: 3 months with Dr Aundra Dubin.

## 2014-01-14 NOTE — Progress Notes (Signed)
Patient ID: Edgar Hogan, male   DOB: 1945/05/26, 68 y.o.   MRN: [id number] PCP: Dr. Melford Aase  68 yo with history of HTN, type II diabetes, OSA, and paroxysmal atrial fibrillation presents for cardiology followup today. Patient had an CXR done not long ago with concern for thoracic aortic aneurysm. He is claustrophobic so came to [facility] on 05/11/13 for general anesthesia to get CTA chest done. Initially, anesthesia flow sheets say he was in NSR. However, at the end of the procedure, he was noted to be in atrial fibrillation. ECG confirmed atrial fibrillation with controlled rate. The CTA showed no aortic aneurysm but there was evidence for mild pulmonary edema and there was coronary calcification.  I saw him in the hospital and started him on a low dose of Toprol XL as well as Eliquis 5 mg bid. I had him do an echocardiogram in 3/15 which showed EF 40-45% with diffuse hypokinesis.  He had volume overload so I started him on Lasix.  In 3/15, I did a TEE-guided cardioversion with successful conversion to NSR.  TEE confirmed EF about 40-45%.  LHC showed EF 45% with nonobstructive coronary disease. After cardioversion, patient continued to feel palpitations.  2 week monitor in 4/15 showed PVCs but no atrial fibrillation.  Repeat echo in 9/15 showed EF 50-55%, mild LVH, mild MR.   Patient has continued to feel a lot of fluttering and palpitations.  Today, ECG shows that he is back in atrial fibrillation with rate in the 90s.  He has seen Thompson Grayer regarding ablation, and it was recommended that he try an anti-arrhythmic prior to ablation.  No chest pain.  Dyspnea with inclines or walking fast up steps.  He has been concerned recently with difficulty with his short-term memory.   ECG: atrial fibrillation, QTc 450 msec, inferior T wave inversions  Labs (3/15): K 4.3, creatinine 0.8 =>0.9, BNP 549, TSH normal, HCT 40.5  Review of systems complete and found to be negative unless listed above in HPI    Past  Medical History:  1. HTN  2. Type II diabetes  3. Gout  4. Hyperlipidemia  5. OSA: Not on CPAP (does not tolerate).   6. Atrial fibrillation: Paroxysmal.  1st diagnosed 3/15. Echo (3/15) with EF 40-45%, diffuse hypokinesis, moderate LVH, moderate MR, moderate LAE, moderately dilated RV with normal RV systolic function. DCCV to NSR in 3/15.  2 week monitor in 4/15 with PVC, no atrial fibrillation.  Back in atrial fibrillation at 11/15 office visit.  7. CAD: LHC 2007 with luminal irregularities. CTA chest (3/15) with coronary artery calcifications. LHC (3/15) with EF 45%, nonobstructive CAD.  8. Cardiomyopathy: Nonischemic, ?tachycardia-mediated.  Echo (3/15) with EF 40-45%, diffuse hypokinesis, moderate LVH, moderate MR, moderate LAE, moderately dilated RV with normal RV systolic function.  TEE (3/15) with EF 40-45%, diffuse hypokinesis, normal RV. Echo (9/15) with EF 50-55%, mild LVH, mild MR.   SH: Prior smoker, quit 1983. Married, wife is dermatopathologist. Has children.  Retired Forensic psychologist.    FH: CAD    ROS: All systems reviewed and negative except as per HPI.   Current Outpatient Prescriptions  Medication Sig Dispense Refill  . allopurinol (ZYLOPRIM) 300 MG tablet Take 300 mg by mouth daily.    Marland Kitchen atorvastatin (LIPITOR) 40 MG tablet Take 40 mg by mouth daily.    Marland Kitchen BRINTELLIX 20 MG TABS Take 1 tablet by mouth daily.  1  . ELIQUIS 5 MG TABS tablet TAKE 1 TABLET BY MOUTH TWICE DAILY  60 tablet 3  . furosemide (LASIX) 40 MG tablet TAKE 1 TABLET BY MOUTH EVERY DAY 30 tablet 11  . lisinopril (PRINIVIL,ZESTRIL) 40 MG tablet Take 1 tablet by mouth daily.  3  . metFORMIN (GLUCOPHAGE) 500 MG tablet Take 500 mg by mouth 2 (two) times daily with a meal.    . metoprolol succinate (TOPROL XL) 50 MG 24 hr tablet Take 1 tablet (50 mg total) by mouth 2 (two) times daily. 180 tablet 3  . Multiple Vitamins-Minerals (MULTIVITAMIN PO) Take 1 tablet by mouth daily.    Marland Kitchen omeprazole (PRILOSEC) 40 MG capsule  Take 40 mg by mouth 2 (two) times daily.    . potassium chloride SA (K-DUR,KLOR-CON) 20 MEQ tablet TAKE 1 TABLET BY MOUTH EVERY DAY 30 tablet 11  . temazepam (RESTORIL) 30 MG capsule Take 30 mg by mouth at bedtime.    . traZODone (DESYREL) 150 MG tablet Take 150 mg by mouth at bedtime.     No current facility-administered medications for this visit.    BP 122/72 mmHg  Pulse 98  Ht 6\' 2"  (1.88 m)  Wt 250 lb (113.399 kg)  BMI 32.08 kg/m2 General: NAD Neck: Thick, JVP 7 cm, no thyromegaly or thyroid nodule.  Lungs: Clear to auscultation bilaterally with normal respiratory effort. CV: Nondisplaced PMI.  Heart irregular S1/S2, no S3/S4, no murmur.  No peripheral edema.  No carotid bruit.  Normal pedal pulses.  Abdomen: Soft, nontender, no hepatosplenomegaly, mild distention.  Skin: Intact without lesions or rashes.  Neurologic: Alert and oriented x 3.  Psych: Normal affect. Extremities: No clubbing or cyanosis.   Assessment/Plan:  1. Abnormal CXR: Patient does not have a thoracic aortic aneurysm by CTA.  2. Atrial fibrillation: Patient is back in atrial fibrillation today after DCCV in 3/15.  He feels palpitations. Risk factors for CVA include OSA, HTN, diabetes, CHF, and age>65. CHADSVASC = 5.   - Continue Eliquis and Toprol XL. - I think that we should try to maintain NSR given symptomatic palpitations and mild cardiomyopathy.  He missed several days of Eliquis 2 weeks ago.  I will plan on admitting him for Tikosyn initiation in 2 weeks.  I think that his QTc interval today is ok for Tikosyn.  Will check BMET and Mg today.  I will refer him to pharmacy clinic to make sure that everything is ready for his admission.  - Check CBC today given Eliquis use.  3. Chronic systolic CHF: Nonischemic cardiomyopathy.  Volume status better, NYHA class II symptoms.  EF up to 50-55% on last echo. No significant coronary disease on LHC. Cardiomyopathy of uncertain etiology: atrial fibrillation was not  rapid even when he was not on Toprol XL, so this seems less likely due to be tachy-mediated cardiomyopathy.    - Continue Toprol XL, lisinopril, and Lasix at current doses.  4. CAD: Nonobstructive CAD on cath.  Continue statin and check lipids today.   5. OSA: Does not tolerate CPAP.  6. Short term memory deficit: He would like neurology evaluation.  I will refer.   [name] 01/14/2014

## 2014-01-15 ENCOUNTER — Telehealth: Payer: Self-pay | Admitting: *Deleted

## 2014-01-15 DIAGNOSIS — I5032 Chronic diastolic (congestive) heart failure: Secondary | ICD-10-CM

## 2014-01-15 DIAGNOSIS — I10 Essential (primary) hypertension: Secondary | ICD-10-CM

## 2014-01-15 DIAGNOSIS — I480 Paroxysmal atrial fibrillation: Secondary | ICD-10-CM

## 2014-01-15 MED ORDER — FENOFIBRATE 48 MG PO TABS: 48.0000 mg | ORAL_TABLET | Freq: Every day | ORAL | Status: DC

## 2014-01-15 MED ORDER — MAGNESIUM OXIDE -MG SUPPLEMENT 400 (240 MG) MG PO TABS: 400.0000 mg | ORAL_TABLET | Freq: Two times a day (BID) | ORAL | Status: DC

## 2014-01-15 NOTE — Telephone Encounter (Signed)
Notes Recorded by Larey Dresser, MD on 01/14/2014 at 10:49 PM Start magnesium oxide 400 mg bid, repeat Mg in 1 week. He needs to start fenofibrate 48 mg daily for elevated triglycerides with lipids in 1 month.  Pt advised,verbalized understanding, agreed with plan.

## 2014-01-23 ENCOUNTER — Ambulatory Visit: Payer: BC Managed Care – PPO | Admitting: Pharmacist

## 2014-01-23 ENCOUNTER — Other Ambulatory Visit: Payer: BC Managed Care – PPO

## 2014-02-06 ENCOUNTER — Encounter (HOSPITAL_COMMUNITY): Payer: Self-pay | Admitting: General Practice

## 2014-02-06 ENCOUNTER — Ambulatory Visit (INDEPENDENT_AMBULATORY_CARE_PROVIDER_SITE_OTHER): Payer: BC Managed Care – PPO | Admitting: Pharmacist

## 2014-02-06 ENCOUNTER — Inpatient Hospital Stay (HOSPITAL_COMMUNITY)
Admission: AD | Admit: 2014-02-06 | Discharge: 2014-02-09 | DRG: 309 | Disposition: A | Discharge status: Home / Self Care | Payer: BC Managed Care – PPO | Source: Ambulatory Visit | Attending: Cardiology | Admitting: Cardiology

## 2014-02-06 DIAGNOSIS — I10 Essential (primary) hypertension: Secondary | ICD-10-CM | POA: Diagnosis present

## 2014-02-06 DIAGNOSIS — K219 Gastro-esophageal reflux disease without esophagitis: Secondary | ICD-10-CM | POA: Diagnosis present

## 2014-02-06 DIAGNOSIS — Z66 Do not resuscitate: Secondary | ICD-10-CM | POA: Diagnosis present

## 2014-02-06 DIAGNOSIS — I2510 Atherosclerotic heart disease of native coronary artery without angina pectoris: Secondary | ICD-10-CM

## 2014-02-06 DIAGNOSIS — E785 Hyperlipidemia, unspecified: Secondary | ICD-10-CM | POA: Diagnosis present

## 2014-02-06 DIAGNOSIS — Z87891 Personal history of nicotine dependence: Secondary | ICD-10-CM | POA: Diagnosis not present

## 2014-02-06 DIAGNOSIS — E119 Type 2 diabetes mellitus without complications: Secondary | ICD-10-CM | POA: Diagnosis present

## 2014-02-06 DIAGNOSIS — I4891 Unspecified atrial fibrillation: Secondary | ICD-10-CM | POA: Diagnosis present

## 2014-02-06 DIAGNOSIS — I5032 Chronic diastolic (congestive) heart failure: Secondary | ICD-10-CM

## 2014-02-06 DIAGNOSIS — I509 Heart failure, unspecified: Secondary | ICD-10-CM | POA: Diagnosis present

## 2014-02-06 DIAGNOSIS — I5022 Chronic systolic (congestive) heart failure: Secondary | ICD-10-CM | POA: Diagnosis present

## 2014-02-06 DIAGNOSIS — M109 Gout, unspecified: Secondary | ICD-10-CM | POA: Diagnosis present

## 2014-02-06 DIAGNOSIS — G4733 Obstructive sleep apnea (adult) (pediatric): Secondary | ICD-10-CM | POA: Diagnosis present

## 2014-02-06 DIAGNOSIS — I481 Persistent atrial fibrillation: Secondary | ICD-10-CM

## 2014-02-06 DIAGNOSIS — F40240 Claustrophobia: Secondary | ICD-10-CM | POA: Diagnosis present

## 2014-02-06 DIAGNOSIS — I493 Ventricular premature depolarization: Secondary | ICD-10-CM | POA: Diagnosis present

## 2014-02-06 DIAGNOSIS — I480 Paroxysmal atrial fibrillation: Principal | ICD-10-CM | POA: Diagnosis present

## 2014-02-06 DIAGNOSIS — Z8739 Personal history of other diseases of the musculoskeletal system and connective tissue: Secondary | ICD-10-CM

## 2014-02-06 HISTORY — DX: Atherosclerotic heart disease of native coronary artery without angina pectoris: I25.10

## 2014-02-06 HISTORY — DX: Gastro-esophageal reflux disease without esophagitis: K21.9

## 2014-02-06 HISTORY — DX: Personal history of other diseases of the musculoskeletal system and connective tissue: Z87.39

## 2014-02-06 HISTORY — DX: Basal cell carcinoma of skin, unspecified: C44.91

## 2014-02-06 HISTORY — DX: Type 2 diabetes mellitus without complications: E11.9

## 2014-02-06 LAB — BASIC METABOLIC PANEL
BUN: 26 mg/dL — AB (ref 6–23)
CO2: 27 mEq/L (ref 19–32)
Calcium: 9.6 mg/dL (ref 8.4–10.5)
Chloride: 104 mEq/L (ref 96–112)
Creatinine, Ser: 1.1 mg/dL (ref 0.4–1.5)
GFR: 73.31 mL/min (ref >60.00)
Glucose, Bld: 122 mg/dL — ABNORMAL HIGH (ref 70–99)
Potassium: 4.2 mEq/L (ref 3.5–5.1)
Sodium: 139 mEq/L (ref 135–145)

## 2014-02-06 LAB — MAGNESIUM
MAGNESIUM: 1.7 mg/dL (ref 1.5–2.5)
Magnesium: 2.1 mg/dL (ref 1.5–2.5)

## 2014-02-06 LAB — GLUCOSE, CAPILLARY
Glucose-Capillary: 89 mg/dL (ref 70–99)
Glucose-Capillary: 177 mg/dL — ABNORMAL HIGH (ref 70–99)

## 2014-02-06 MED ORDER — MAGNESIUM OXIDE -MG SUPPLEMENT 400 (240 MG) MG PO TABS: 400.0000 mg | ORAL_TABLET | Freq: Two times a day (BID) | ORAL | Status: DC

## 2014-02-06 MED ORDER — MAGNESIUM OXIDE -MG SUPPLEMENT 400 (240 MG) MG PO TABS: 600.0000 mg | ORAL_TABLET | Freq: Two times a day (BID) | ORAL | Status: DC

## 2014-02-06 MED ORDER — APIXABAN 5 MG PO TABS
5.0000 mg | ORAL_TABLET | Freq: Two times a day (BID) | ORAL | Status: DC
  Administered 2014-02-06 – 2014-02-08 (×5): 5 mg via ORAL
  Filled 2014-02-06 (×8): qty 1

## 2014-02-06 MED ORDER — SODIUM CHLORIDE 0.9 % IJ SOLN: 3.0000 mL | INTRAMUSCULAR | Status: DC | PRN

## 2014-02-06 MED ORDER — METOPROLOL SUCCINATE ER 50 MG PO TB24
50.0000 mg | ORAL_TABLET | Freq: Two times a day (BID) | ORAL | Status: DC
  Administered 2014-02-06 – 2014-02-08 (×5): 50 mg via ORAL
  Filled 2014-02-06 (×7): qty 1

## 2014-02-06 MED ORDER — TRAZODONE HCL 150 MG PO TABS
150.0000 mg | ORAL_TABLET | Freq: Every day | ORAL | Status: DC
  Administered 2014-02-06 – 2014-02-08 (×3): 150 mg via ORAL
  Filled 2014-02-06 (×4): qty 1

## 2014-02-06 MED ORDER — VORTIOXETINE HBR 20 MG PO TABS: 20.0000 mg | ORAL_TABLET | Freq: Every day | ORAL | Status: DC

## 2014-02-06 MED ORDER — MAGNESIUM OXIDE 400 (241.3 MG) MG PO TABS
600.0000 mg | ORAL_TABLET | Freq: Two times a day (BID) | ORAL | Status: DC
  Administered 2014-02-06 – 2014-02-08 (×5): 600 mg via ORAL
  Filled 2014-02-06 (×7): qty 1.5

## 2014-02-06 MED ORDER — ALLOPURINOL 300 MG PO TABS
300.0000 mg | ORAL_TABLET | Freq: Every day | ORAL | Status: DC
  Administered 2014-02-07 – 2014-02-08 (×2): 300 mg via ORAL
  Filled 2014-02-06 (×3): qty 1

## 2014-02-06 MED ORDER — LISINOPRIL 40 MG PO TABS
40.0000 mg | ORAL_TABLET | Freq: Every day | ORAL | Status: DC
  Administered 2014-02-06 – 2014-02-08 (×3): 40 mg via ORAL
  Filled 2014-02-06 (×4): qty 1

## 2014-02-06 MED ORDER — TEMAZEPAM 15 MG PO CAPS
30.0000 mg | ORAL_CAPSULE | Freq: Every day | ORAL | Status: DC
  Administered 2014-02-06 – 2014-02-08 (×3): 30 mg via ORAL
  Filled 2014-02-06 (×3): qty 2

## 2014-02-06 MED ORDER — METFORMIN HCL 500 MG PO TABS
500.0000 mg | ORAL_TABLET | Freq: Two times a day (BID) | ORAL | Status: DC
  Administered 2014-02-07 – 2014-02-09 (×4): 500 mg via ORAL
  Filled 2014-02-06 (×7): qty 1

## 2014-02-06 MED ORDER — SODIUM CHLORIDE 0.9 % IV SOLN
250.0000 mL | INTRAVENOUS | Status: DC | PRN
  Administered 2014-02-08: 09:00:00 via INTRAVENOUS

## 2014-02-06 MED ORDER — MAGNESIUM SULFATE 2 GM/50ML IV SOLN
2.0000 g | Freq: Once | INTRAVENOUS | Status: AC
  Administered 2014-02-06: 2 g via INTRAVENOUS
  Filled 2014-02-06 (×2): qty 50

## 2014-02-06 MED ORDER — TRAZODONE HCL 150 MG PO TABS: 150.0000 mg | ORAL_TABLET | Freq: Every day | ORAL | Status: DC

## 2014-02-06 MED ORDER — SODIUM CHLORIDE 0.9 % IJ SOLN
3.0000 mL | Freq: Two times a day (BID) | INTRAMUSCULAR | Status: DC
  Administered 2014-02-06 – 2014-02-07 (×4): 3 mL via INTRAVENOUS

## 2014-02-06 MED ORDER — POTASSIUM CHLORIDE CRYS ER 20 MEQ PO TBCR
20.0000 meq | EXTENDED_RELEASE_TABLET | Freq: Every day | ORAL | Status: DC
  Administered 2014-02-07 – 2014-02-08 (×2): 20 meq via ORAL
  Filled 2014-02-06 (×3): qty 1

## 2014-02-06 MED ORDER — FUROSEMIDE 40 MG PO TABS
40.0000 mg | ORAL_TABLET | Freq: Every day | ORAL | Status: DC
  Administered 2014-02-07 – 2014-02-08 (×2): 40 mg via ORAL
  Filled 2014-02-06 (×3): qty 1

## 2014-02-06 MED ORDER — INSULIN ASPART 100 UNIT/ML ~~LOC~~ SOLN
0.0000 [IU] | Freq: Three times a day (TID) | SUBCUTANEOUS | Status: DC
  Administered 2014-02-08: 1 [IU] via SUBCUTANEOUS

## 2014-02-06 MED ORDER — VORTIOXETINE HBR 20 MG PO TABS
20.0000 mg | ORAL_TABLET | Freq: Every day | ORAL | Status: DC
  Administered 2014-02-06 – 2014-02-08 (×3): 20 mg via ORAL
  Filled 2014-02-06: qty 20

## 2014-02-06 MED ORDER — ATORVASTATIN CALCIUM 40 MG PO TABS
40.0000 mg | ORAL_TABLET | Freq: Every day | ORAL | Status: DC
  Administered 2014-02-07 – 2014-02-08 (×2): 40 mg via ORAL
  Filled 2014-02-06 (×3): qty 1

## 2014-02-06 MED ORDER — DOFETILIDE 500 MCG PO CAPS
500.0000 ug | ORAL_CAPSULE | Freq: Two times a day (BID) | ORAL | Status: DC
  Administered 2014-02-06: 500 ug via ORAL
  Filled 2014-02-06 (×4): qty 1

## 2014-02-06 NOTE — Assessment & Plan Note (Signed)
Reviewed checklist for Tikosyn admission.  Pt anticoagulated appropriately with Eliquis.  His QTc is <416msec.  He is currently taking trazadone for sleep so this will need to be addressed in the hospital.  Reviewed labs.  K 4.2, Mg 1.7, SCr 1.1.  Discussed with Dr. Aundra Dubin.  [name] Mg has increased from 1.4 to 1.7 over the past month with addition of mag ox 400mg  BID but still too low to start Tikosyn.  Will go ahead with admission and give him 2gm IV Mg and recheck STAT labs after infusion will goal to start medication tonight.  [name] CrCl- 163mL/min.Would start at 567mcg BID and adjust as needed based on QTc.  Pt aware to report to hospital for admission.

## 2014-02-06 NOTE — Progress Notes (Addendum)
Pt on floor, MD notified for orders. Kathleen Argue S 2:43 PM   Orders to be placed. Kathleen Argue S 3:00 PM

## 2014-02-06 NOTE — H&P (Signed)
Edgar Hogan is an 68 y.o. male.   Chief Complaint:  Afib HPI:   68 yo with history of HTN, type II diabetes, OSA, and paroxysmal atrial fibrillation. Patient had an CXR done not long ago with concern for thoracic aortic aneurysm. He is claustrophobic so came to [facility] on 05/11/13 for general anesthesia to get CTA chest done. Initially, anesthesia flow sheets say he was in NSR. However, at the end of the procedure, he was noted to be in atrial fibrillation. ECG confirmed atrial fibrillation with controlled rate. The CTA showed no aortic aneurysm but there was evidence for mild pulmonary edema and there was coronary calcification. Dr. Aundra Dubin saw him in the hospital and started him on a low dose of Toprol XL as well as Eliquis 5 mg bid.  Echocardiogram in 3/15  showed EF 40-45% with diffuse hypokinesis. He had volume overload and was started on Lasix. In 3/15, Dr. Aundra Dubin did a TEE-guided cardioversion with successful conversion to NSR. TEE confirmed EF about 40-45%. LHC showed EF 45% with nonobstructive coronary disease. After cardioversion, patient continued to feel palpitations. 2 week monitor in 4/15 showed PVCs but no atrial fibrillation. Repeat echo in 9/15 showed EF 50-55%, mild LVH, mild MR.  The patient also has a history of DM, HTN, HLD, gout, OSA-CPAP, NISCM.   The patient was seen by Dr. Aundra Dubin on January 14, 2014 and he was back in Afib. Antiarrhythmic therapy was recommended by Dr. Rayann Heman prior to ablation.  The patient presents today for tikosyn loading.  The patient currently denies nausea, vomiting, fever, chest pain, shortness of breath bey, orthopnea, dizziness, PND, cough, congestion, abdominal pain, hematochezia, melena, lower extremity edema, claudication.   Past Medical History  Diagnosis Date  . Atrial fibrillation   . Type II or unspecified type diabetes mellitus without mention of complication, not stated as uncontrolled   . HLD (hyperlipidemia)   . Unspecified  essential hypertension   . OSA (obstructive sleep apnea)   . Gout   . CHF (congestive heart failure)     Past Surgical History  Procedure Laterality Date  . Radiology with anesthesia N/A 05/11/2013    Procedure: RADIOLOGY WITH ANESTHESIA-CT;  Surgeon: Medication Radiologist, MD;  Location: [place];  Service: Radiology;  Laterality: N/A;  . Tee without cardioversion N/A 05/26/2013    Procedure: TRANSESOPHAGEAL ECHOCARDIOGRAM (TEE);  Surgeon: Larey Dresser, MD;  Location: [place];  Service: Cardiovascular;  Laterality: N/A;  . Cardioversion N/A 05/26/2013    Procedure: CARDIOVERSION;  Surgeon: Larey Dresser, MD;  Location: [facility] ENDOSCOPY;  Service: Cardiovascular;  Laterality: N/A;    Family History  Problem Relation Age of Onset  . CAD      family history  . Heart attack Mother   . Heart attack Father    Social History:  reports that he has quit smoking. He does not have any smokeless tobacco history on file. He reports that he does not drink alcohol or use illicit drugs.  Allergies: No Known Allergies  Medications Prior to Admission  Medication Sig Dispense Refill  . allopurinol (ZYLOPRIM) 300 MG tablet Take 300 mg by mouth daily.    Marland Kitchen atorvastatin (LIPITOR) 40 MG tablet Take 40 mg by mouth daily.    Marland Kitchen BRINTELLIX 20 MG TABS Take 1 tablet by mouth at bedtime.   1  . ELIQUIS 5 MG TABS tablet TAKE 1 TABLET BY MOUTH TWICE DAILY 60 tablet 3  . furosemide (LASIX) 40 MG tablet TAKE  1 TABLET BY MOUTH EVERY DAY 30 tablet 11  . lisinopril (PRINIVIL,ZESTRIL) 40 MG tablet Take 1 tablet by mouth daily.  3  . Magnesium Oxide 400 (240 MG) MG TABS Take 400 mg by mouth 2 (two) times daily. 60 tablet 6  . metFORMIN (GLUCOPHAGE) 500 MG tablet Take 500 mg by mouth 2 (two) times daily with a meal.    . metoprolol succinate (TOPROL XL) 50 MG 24 hr tablet Take 1 tablet (50 mg total) by mouth 2 (two) times daily. 180 tablet 3  . Multiple Vitamins-Minerals (MULTIVITAMIN PO) Take 1 tablet by mouth  daily.    Marland Kitchen omeprazole (PRILOSEC) 40 MG capsule Take 40 mg by mouth 2 (two) times daily.    . potassium chloride SA (K-DUR,KLOR-CON) 20 MEQ tablet TAKE 1 TABLET BY MOUTH EVERY DAY 30 tablet 11  . temazepam (RESTORIL) 30 MG capsule Take 30 mg by mouth at bedtime.    . traZODone (DESYREL) 150 MG tablet Take 150 mg by mouth at bedtime.      Results for orders placed or performed in visit on 02/06/14 (from the past 48 hour(s))  Basic metabolic panel     Status: Abnormal   Collection Time: 02/06/14 10:18 AM  Result Value Ref Range   Sodium 139 135 - 145 mEq/L   Potassium 4.2 3.5 - 5.1 mEq/L   Chloride 104 96 - 112 mEq/L   CO2 27 19 - 32 mEq/L   Glucose, Bld 122 (H) 70 - 99 mg/dL   BUN 26 (H) 6 - 23 mg/dL   Creatinine, Ser 1.1 0.4 - 1.5 mg/dL   Calcium 9.6 8.4 - 10.5 mg/dL   GFR 73.31 >60.00 mL/min  Magnesium     Status: None   Collection Time: 02/06/14 10:18 AM  Result Value Ref Range   Magnesium 1.7 1.5 - 2.5 mg/dL   No results found.  Review of Systems  Constitutional: Negative for fever and diaphoresis.  HENT: Negative for congestion.   Respiratory: Positive for shortness of breath. Negative for cough.   Cardiovascular: Positive for palpitations. Negative for chest pain, orthopnea, leg swelling and PND.  Gastrointestinal: Negative for nausea, vomiting, abdominal pain, blood in stool and melena.  Genitourinary: Negative for hematuria.  Musculoskeletal: Negative for myalgias.  Neurological: Negative for dizziness and weakness.  All other systems reviewed and are negative.   Blood pressure 132/96, pulse 100, temperature 98 F (36.7 C), temperature source Oral, resp. rate 18, height 6\' 2"  (1.88 m), weight 251 lb 8.7 oz (114.1 kg), SpO2 99 %. Physical Exam  Nursing note and vitals reviewed. Constitutional: He is oriented to person, place, and time. He appears well-developed and well-nourished. No distress.  HENT:  Head: Normocephalic and atraumatic.  Eyes: EOM are normal.  Pupils are equal, round, and reactive to light. No scleral icterus.  Neck: Normal range of motion. Neck supple. No JVD present.  Cardiovascular: Normal rate, S1 normal and S2 normal.  An irregularly irregular rhythm present.  No murmur heard. Pulses:      Radial pulses are 2+ on the right side, and 2+ on the left side.       Dorsalis pedis pulses are 2+ on the right side, and 2+ on the left side.  Respiratory: Effort normal and breath sounds normal. He has no wheezes. He has no rales.  GI: Soft. Bowel sounds are normal. He exhibits no distension. There is no tenderness.  Musculoskeletal: He exhibits no edema.  Lymphadenopathy:    He has no  cervical adenopathy.  Neurological: He is alert and oriented to person, place, and time. He exhibits normal muscle tone.  Skin: Skin is warm and dry.  Psychiatric: He has a normal mood and affect.     Assessment/Plan Atrial Fib The patient will be given 2mg  mag sulfate, baseline EKG-QTC then started on tikosyn.  Serial EKGS.  Rate is currently controlled.  Potassium is > 4.0.    DNR  The patient confirmed this after questioning.  HTN  BP ok.    DM  Continue metformin, CBG ACHS, SS insulin-sensitive   HAGER, BRYAN, PAC 02/06/2014, 2:43 PM  Patient seen with PA, agree with the above note.  He remains in atrial fibrillation today and reports increased fatigue.  Last echo showed EF 50-55%.  ECG today with QTc not prolonged.  K is ok but Mg is 1.7.  Will give 2 g MgSO4 now, repeat Mg after IV Mg given.  If Mg 1.8 or above, will start Tikosyn 500 mcg bid tonight.  Will cardiovert on Thursday if he remains in atrial fibrillation.  He has not missed any Eliquis doses.   [name] 02/06/2014 4:29 PM

## 2014-02-06 NOTE — Plan of Care (Signed)
Problem: Phase I Progression Outcomes Goal: Ventricular heart rate < 120/min Outcome: Completed/Met Date Met:  02/06/14 Goal: Anticoagulation Therapy per MD order Outcome: Completed/Met Date Met:  02/06/14 Goal: Heart rate or rhythm control medication Outcome: Completed/Met Date Met:  02/06/14 Goal: Pain controlled with appropriate interventions Outcome: Completed/Met Date Met:  02/06/14 Goal: Initial discharge plan identified Outcome: Completed/Met Date Met:  02/06/14 Goal: Hemodynamically stable Outcome: Completed/Met Date Met:  02/06/14

## 2014-02-06 NOTE — Progress Notes (Signed)
Utilization review completed.  

## 2014-02-06 NOTE — Progress Notes (Signed)
HPI  Edgar Hogan is a 68 yo M pt of Dr. Aundra Dubin and Dr. Rayann Heman who has a histoyr of paroxysmal afib, OSA, and DM.  He was first diagnosed with atrial fibrillation in March 2015 after undergoing CTA under general anesthesia.  He was started on low dose metoprolol and Eliquis at that time and underwent successful TEE/DCCV.   Unfortunately after the cardioversion, he continued to have palpitations and a 2 week monitor in April showed PVCs but no atrial fibrillation.   Dr. Rayann Heman saw him in consult in May.  Given he only had 1 documented episode of atrial fibrillation, he elected to continue to watch before starting AAD therapy.   At his visit with Dr. Aundra Dubin in November he was back in atrial fibrillation so decision was made to start Tikosyn at that time.     Reviewed potential side effects with patient including QTc prolongation.  He is aware of the importance of compliance and will call us if he misses more than 2 doses of Tikosyn in a row.  Reviewed medication list.  He is not currently taking any contraindicated medications but is on trazadone for sleep which may increase QTc.  He also takes temazepam to help with sleep as well.  These agents may have to be adjusted in the hospital to prevent risk of QTc prolongation.  He is anticoagulated with Eliquis.  He reports compliance with Eliquis over the past 30 days.    EKG reviewed by Dr. Lovena Le showed Atrial fibrillation with ventricular rate of 85bpm.  QTc 462msec.    Current Outpatient Prescriptions on File Prior to Visit  Medication Sig Dispense Refill  . allopurinol (ZYLOPRIM) 300 MG tablet Take 300 mg by mouth daily.    Marland Kitchen atorvastatin (LIPITOR) 40 MG tablet Take 40 mg by mouth daily.    Marland Kitchen BRINTELLIX 20 MG TABS Take 1 tablet by mouth at bedtime.   1  . ELIQUIS 5 MG TABS tablet TAKE 1 TABLET BY MOUTH TWICE DAILY 60 tablet 3  . furosemide (LASIX) 40 MG tablet TAKE 1 TABLET BY MOUTH EVERY DAY 30 tablet 11  . lisinopril (PRINIVIL,ZESTRIL) 40 MG tablet  Take 1 tablet by mouth daily.  3  . Magnesium Oxide 400 (240 MG) MG TABS Take 400 mg by mouth 2 (two) times daily. 60 tablet 6  . metFORMIN (GLUCOPHAGE) 500 MG tablet Take 500 mg by mouth 2 (two) times daily with a meal.    . metoprolol succinate (TOPROL XL) 50 MG 24 hr tablet Take 1 tablet (50 mg total) by mouth 2 (two) times daily. 180 tablet 3  . Multiple Vitamins-Minerals (MULTIVITAMIN PO) Take 1 tablet by mouth daily.    Marland Kitchen omeprazole (PRILOSEC) 40 MG capsule Take 40 mg by mouth 2 (two) times daily.    . potassium chloride SA (K-DUR,KLOR-CON) 20 MEQ tablet TAKE 1 TABLET BY MOUTH EVERY DAY 30 tablet 11  . temazepam (RESTORIL) 30 MG capsule Take 30 mg by mouth at bedtime.    . traZODone (DESYREL) 150 MG tablet Take 150 mg by mouth at bedtime.     No current facility-administered medications on file prior to visit.   No Known Allergies

## 2014-02-06 NOTE — Progress Notes (Addendum)
Pharmacy Consult for Dofetilide (Tikosyn) Initiation  Admit Complaint: 68 y.o. male admitted 02/06/2014 with atrial fibrillation to be initiated on dofetilide.   Assessment:  Patient Exclusion Criteria: If any screening criteria checked as "Yes", then  patient  should NOT receive dofetilide until criteria item is corrected. If "Yes" please indicate correction plan.  YES  NO Patient  Exclusion Criteria Correction Plan  [x]  []  Baseline QTc interval is greater than or equal to 440 msec. IF above YES box checked dofetilide contraindicated unless patient has ICD; then may proceed if QTc 500-550 msec or with known ventricular conduction abnormalities may proceed with QTc 550-600 msec. QTc = 442 Spoke with Dr. Tommi Rumps. QTc may not be accurate. Proceed with Tikosyn and continue to monitot  []  [x]  Magnesium level is less than 1.8 mEq/l : Last magnesium: 2.1 on 12/1 Lab Results  Component Value Date   MG 2.1 02/06/2014         []  [x]  Potassium level is less than 4 mEq/l : Last potassium: 4.2 on 12/1 Lab Results  Component Value Date   K 4.2 02/06/2014         []  [x]  Patient is known or suspected to have a digoxin level greater than 2 ng/ml: No results found for: DIGOXIN    []  [x]  Creatinine clearance less than 20 ml/min (calculated using Cockcroft-Gault, actual body weight and serum creatinine):  CrCl ~ 105 ml/min     []  [x]  Patient has received drugs known to prolong the QT intervals within the last 48 hours(phenothiazines, tricyclics or tetracyclic antidepressants, erythromycin, H-1 antihistamines, cisapride, fluoroquinolones, azithromycin). Drugs not listed above may have an, as yet, undetected potential to prolong the QT interval, updated information on QT prolonging agents is available at this website:QT prolonging agents Patient was on trazodone which may prolong the QTc. This medication is not an absolute  contraindication. Trazodone has been discontinued after discussion with Tarri Fuller PA.  []  [x]  Patient received a dose of hydrochlorothiazide (Oretic) alone or in any combination including triamterene (Dyazide, Maxzide) in the last 48 hours.   []  [x]  Patient received a medication known to increase dofetilide plasma concentrations prior to initial dofetilide dose:  . Trimethoprim (Primsol, Proloprim) in the last 36 hours . Verapamil (Calan, Verelan) in the last 36 hours or a sustained release dose in the last 72 hours . Megestrol (Megace) in the last 5 days  . Cimetidine (Tagamet) in the last 6 hours . Ketoconazole (Nizoral) in the last 24 hours . Itraconazole (Sporanox) in the last 48 hours  . Prochlorperazine (Compazine) in the last 36 hours    []  [x]  Patient is known to have a history of torsades de pointes; congenital or acquired long QT syndromes.   []  [x]  Patient has received a Class 1 antiarrhythmic with less than 2 half-lives since last dose. (Disopyramide, Quinidine, Procainamide, Lidocaine, Mexiletine, Flecainide, Propafenone)   []  [x]  Patient has received amiodarone therapy in the past 3 months or amiodarone level is greater than 0.3 ng/ml.    Patient has been appropriately anticoagulated with apixiban  Ordering provider was confirmed at [email or web address] if they are not listed on the [place] Prescribers list.  Goal of Therapy: Follow renal function, electrolytes, potential drug interactions, and dose adjustment. Provide education and 1 week supply at discharge.  Plan:  [x]   Physician selected initial dose within range recommended for patients level of renal function - will monitor for response.  []   Physician selected initial dose outside of range recommended  for patients level of renal function - will discuss if the dose should be altered at this time.   Select One Calculated CrCl  Dose q12h  [x]  > 60 ml/min 500 mcg  []  40-60 ml/min 250 mcg  []  20-40 ml/min 125 mcg   2. Follow up QTc after the first 5 doses, renal function, electrolytes  (K & Mg) daily x 3     days, dose adjustment, success of initiation and facilitate 1 week discharge supply as     clinically indicated.  3. Initiate Tikosyn education video (Call [phone] and ask for video # [phone]).  4. Place Enrollment Form on the chart for discharge supply of dofetilide.  Hildred Laser, Pharm D 02/06/2014 8:05 PM

## 2014-02-07 LAB — BASIC METABOLIC PANEL
Anion gap: 13 (ref 5–15)
BUN: 24 mg/dL — AB (ref 6–23)
CALCIUM: 9.2 mg/dL (ref 8.4–10.5)
CO2: 27 mEq/L (ref 19–32)
Chloride: 101 mEq/L (ref 96–112)
Creatinine, Ser: 1.16 mg/dL (ref 0.50–1.35)
GFR calc Af Amer: 74 mL/min — ABNORMAL LOW (ref >90)
GFR, EST NON AFRICAN AMERICAN: 64 mL/min — AB (ref >90)
Glucose, Bld: 107 mg/dL — ABNORMAL HIGH (ref 70–99)
POTASSIUM: 4.4 meq/L (ref 3.7–5.3)
Sodium: 141 mEq/L (ref 137–147)

## 2014-02-07 LAB — GLUCOSE, CAPILLARY
GLUCOSE-CAPILLARY: 128 mg/dL — AB (ref 70–99)
GLUCOSE-CAPILLARY: 94 mg/dL (ref 70–99)
GLUCOSE-CAPILLARY: 104 mg/dL — AB (ref 70–99)
Glucose-Capillary: 138 mg/dL — ABNORMAL HIGH (ref 70–99)

## 2014-02-07 LAB — MAGNESIUM: Magnesium: 2 mg/dL (ref 1.5–2.5)

## 2014-02-07 MED ORDER — DOFETILIDE 250 MCG PO CAPS
250.0000 ug | ORAL_CAPSULE | Freq: Two times a day (BID) | ORAL | Status: DC
  Administered 2014-02-07 – 2014-02-09 (×5): 250 ug via ORAL
  Filled 2014-02-07 (×8): qty 1

## 2014-02-07 MED ORDER — TEMAZEPAM 15 MG PO CAPS: 30.0000 mg | ORAL_CAPSULE | Freq: Every day | ORAL | Status: DC

## 2014-02-07 NOTE — Progress Notes (Signed)
Pt watching Tikosyn video at present. Sherrie Mustache 9:53 AM

## 2014-02-07 NOTE — Progress Notes (Signed)
Patient ID: Edgar Hogan, male   DOB: 1945-03-22, 68 y.o.   MRN: [id number]   SUBJECTIVE: Patient remains in atrial fibrillation.  No complaints this morning.  12 am ECG  Scheduled Meds: . allopurinol  300 mg Oral Daily  . apixaban  5 mg Oral BID  . atorvastatin  40 mg Oral Daily  . dofetilide  250 mcg Oral BID  . furosemide  40 mg Oral Daily  . insulin aspart  0-9 Units Subcutaneous TID WC  . lisinopril  40 mg Oral Daily  . magnesium oxide  600 mg Oral BID  . metFORMIN  500 mg Oral BID WC  . metoprolol succinate  50 mg Oral BID  . potassium chloride SA  20 mEq Oral Daily  . sodium chloride  3 mL Intravenous Q12H  . temazepam  30 mg Oral QHS  . traZODone  150 mg Oral QHS  . Vortioxetine HBr  20 mg Oral QHS   Continuous Infusions:  PRN Meds:.sodium chloride, sodium chloride    Filed Vitals:   02/06/14 1434 02/06/14 2053 02/07/14 0426 02/07/14 0612  BP: 132/96 116/76 96/65   Pulse: 100 97 72   Temp: 98 F (36.7 C) 98.3 F (36.8 C) 97.3 F (36.3 C)   TempSrc: Oral Oral Oral   Resp: 18 18 16    Height: 6\' 2"  (1.88 m)     Weight: 251 lb 8.7 oz (114.1 kg)   249 lb 1.6 oz (112.991 kg)  SpO2: 99% 92% 95%     Intake/Output Summary (Last 24 hours) at 02/07/14 0729 Last data filed at 02/06/14 2300  Gross per 24 hour  Intake   1080 ml  Output      0 ml  Net   1080 ml    LABS: Basic Metabolic Panel:  Recent Labs  02/06/14 1018 02/06/14 1837 02/07/14 0500  NA 139  --  141  K 4.2  --  4.4  CL 104  --  101  CO2 27  --  27  GLUCOSE 122*  --  107*  BUN 26*  --  24*  CREATININE 1.1  --  1.16  CALCIUM 9.6  --  9.2  MG 1.7 2.1 2.0   Liver Function Tests: No results for input(s): AST, ALT, ALKPHOS, BILITOT, PROT, ALBUMIN in the last 72 hours. No results for input(s): LIPASE, AMYLASE in the last 72 hours. CBC: No results for input(s): WBC, NEUTROABS, HGB, HCT, MCV, PLT in the last 72 hours. Cardiac Enzymes: No results for input(s): CKTOTAL, CKMB, CKMBINDEX, TROPONINI  in the last 72 hours. BNP: Invalid input(s): POCBNP D-Dimer: No results for input(s): DDIMER in the last 72 hours. Hemoglobin A1C: No results for input(s): HGBA1C in the last 72 hours. Fasting Lipid Panel: No results for input(s): CHOL, HDL, LDLCALC, TRIG, CHOLHDL, LDLDIRECT in the last 72 hours. Thyroid Function Tests: No results for input(s): TSH, T4TOTAL, T3FREE, THYROIDAB in the last 72 hours.  Invalid input(s): FREET3 Anemia Panel: No results for input(s): VITAMINB12, FOLATE, FERRITIN, TIBC, IRON, RETICCTPCT in the last 72 hours.  RADIOLOGY: No results found.  PHYSICAL EXAM General: NAD Neck: No JVD, no thyromegaly or thyroid nodule.  Lungs: Clear to auscultation bilaterally with normal respiratory effort. CV: Nondisplaced PMI.  Heart regular S1/S2, no S3/S4, no murmur.  No peripheral edema.  No carotid bruit.  Normal pedal pulses.  Abdomen: Soft, nontender, no hepatosplenomegaly, no distention.  Neurologic: Alert and oriented x 3.  Psych: Normal affect. Extremities: No clubbing or cyanosis.  TELEMETRY: Reviewed telemetry pt in atrial fibrillation  ASSESSMENT AND PLAN: 68 yo with history of atrial fibrillation, DM2, HTN is here for initiation of Tikosyn.  K and Mg are ok this morning but ECG after 1st dose of Tikosyn is concerning for prolonged QT interval.  The automatic read-out is 573 msec but I do not think this is accurate due to baseline artifact.  However, it does appear to have some prolongation.  - ECG this morning and decrease Tikosyn to 250 mcg bid.  - Repeat ECG 3 hours after am dose.  - If he remains in atrial fibrillation after Thursday am dose, will plan DCCV.   [name] 02/07/2014 7:35 AM

## 2014-02-07 NOTE — Progress Notes (Signed)
Per Dr. Hedwig Morton okay to place home med orders noted in sticky note. Kathleen Argue S 8:07 AM

## 2014-02-07 NOTE — Progress Notes (Signed)
12 lead EKG did not transfer to record, MD at bedside and okayed 8am dose of Tikosyn, QTC <500. Will continue to monitor. Sherrie Mustache

## 2014-02-07 NOTE — Care Management Note (Addendum)
  Page 1 of 1   02/07/2014     2:22:19 PM CARE MANAGEMENT NOTE 02/07/2014  Patient:  Edgar Hogan, Edgar Hogan   Account Number:  [id number]  Date Initiated:  02/07/2014  Documentation initiated by:  Marvetta Gibbons  Subjective/Objective Assessment:   Pt admitted for Tikosyn load for Afib     Action/Plan:   PTA pt lived at home with spouse- plan to return home   Anticipated DC Date:  02/09/2014   Anticipated DC Plan:  [place]  CM consult  Medication Assistance      Choice offered to / List presented to:             Status of service:  In process, will continue to follow Medicare Important Message given?   (If response is "NO", the following Medicare IM given date fields will be blank) Date Medicare IM given:   Medicare IM given by:   Date Additional Medicare IM given:   Additional Medicare IM given by:    Discharge Disposition:    Per UR Regulation:  Reviewed for med. necessity/level of care/duration of stay  If discussed at [place] of Stay Meetings, dates discussed:    Comments:  02/07/14- 1400- Marvetta Gibbons RN, BSN [phone] Referral for Tikosyn- benefits check submitted- pending- spoke with pt at bedside- per pt uses- [facility]- call made to pharmacy- and they are in [place] program and are able to order drug once prescription received. Pt will be given 7 day supply from [facility] prior to discharge- Will need script for 7 day supply and then original with refills to take to his pharmacy. NCM to continue to follow for Tikosyn needs  update- PER REP AT [facility]:  TIKOSYN: COVERED/ESTIMATED COST IS $0 AT RETAIL AT [facility] AND SAME FOR MAIL ORDER/ NO AUTH REQUIRED

## 2014-02-07 NOTE — Plan of Care (Signed)
Problem: Consults Goal: Atrial Arhythmia Patient Education (See Patient Education module for education specifics.)  Outcome: Progressing Goal: Skin Care Protocol Initiated - if Braden Score 18 or less If consults are not indicated, leave blank or document N/A  Outcome: Not Applicable Date Met:  80/03/49 Goal: Tobacco Cessation referral if indicated Outcome: Not Applicable Date Met:  17/91/50 Goal: Nutrition Consult-if indicated Outcome: Not Applicable Date Met:  56/97/94 Goal: Diabetes Guidelines if Diabetic/Glucose > 140 If diabetic or lab glucose is > 140 mg/dl - Initiate Diabetes/Hyperglycemia Guidelines & Document Interventions  Outcome: Progressing

## 2014-02-08 ENCOUNTER — Encounter (HOSPITAL_COMMUNITY)
Admission: AD | Disposition: A | Discharge status: Home / Self Care | Payer: Self-pay | Source: Ambulatory Visit | Attending: Cardiology

## 2014-02-08 ENCOUNTER — Inpatient Hospital Stay (HOSPITAL_COMMUNITY): Payer: BC Managed Care – PPO | Admitting: Anesthesiology

## 2014-02-08 ENCOUNTER — Encounter (HOSPITAL_COMMUNITY): Payer: Self-pay | Admitting: Anesthesiology

## 2014-02-08 DIAGNOSIS — I4891 Unspecified atrial fibrillation: Secondary | ICD-10-CM

## 2014-02-08 HISTORY — PX: CARDIOVERSION: SHX1299

## 2014-02-08 LAB — GLUCOSE, CAPILLARY
GLUCOSE-CAPILLARY: 107 mg/dL — AB (ref 70–99)
GLUCOSE-CAPILLARY: 142 mg/dL — AB (ref 70–99)
Glucose-Capillary: 130 mg/dL — ABNORMAL HIGH (ref 70–99)
Glucose-Capillary: 99 mg/dL (ref 70–99)

## 2014-02-08 LAB — BASIC METABOLIC PANEL
ANION GAP: 12 (ref 5–15)
BUN: 25 mg/dL — ABNORMAL HIGH (ref 6–23)
CHLORIDE: 101 meq/L (ref 96–112)
CO2: 28 mEq/L (ref 19–32)
Calcium: 9.1 mg/dL (ref 8.4–10.5)
Creatinine, Ser: 1.1 mg/dL (ref 0.50–1.35)
GFR calc Af Amer: 79 mL/min — ABNORMAL LOW (ref >90)
GFR calc non Af Amer: 68 mL/min — ABNORMAL LOW (ref >90)
GLUCOSE: 112 mg/dL — AB (ref 70–99)
Potassium: 4.4 mEq/L (ref 3.7–5.3)
Sodium: 141 mEq/L (ref 137–147)

## 2014-02-08 LAB — MAGNESIUM: MAGNESIUM: 2 mg/dL (ref 1.5–2.5)

## 2014-02-08 SURGERY — CARDIOVERSION
Anesthesia: General

## 2014-02-08 MED ORDER — ONDANSETRON HCL 4 MG/2ML IJ SOLN: 4.0000 mg | Freq: Four times a day (QID) | INTRAMUSCULAR | Status: DC | PRN

## 2014-02-08 MED ORDER — ALPRAZOLAM 0.25 MG PO TABS: 0.2500 mg | ORAL_TABLET | Freq: Two times a day (BID) | ORAL | Status: DC | PRN

## 2014-02-08 MED ORDER — PROPOFOL 10 MG/ML IV BOLUS
INTRAVENOUS | Status: DC | PRN
  Administered 2014-02-08: 80 mg via INTRAVENOUS

## 2014-02-08 MED ORDER — LIDOCAINE HCL (CARDIAC) 20 MG/ML IV SOLN
INTRAVENOUS | Status: DC | PRN
  Administered 2014-02-08: 50 mg via INTRAVENOUS

## 2014-02-08 MED ORDER — TRAMADOL HCL 50 MG PO TABS
50.0000 mg | ORAL_TABLET | Freq: Four times a day (QID) | ORAL | Status: DC | PRN
  Administered 2014-02-08: 50 mg via ORAL
  Filled 2014-02-08: qty 1

## 2014-02-08 MED ORDER — SODIUM CHLORIDE 0.9 % IJ SOLN: 3.0000 mL | INTRAMUSCULAR | Status: DC | PRN

## 2014-02-08 MED ORDER — PROMETHAZINE HCL 25 MG/ML IJ SOLN: 6.2500 mg | INTRAMUSCULAR | Status: DC | PRN

## 2014-02-08 MED ORDER — ACETAMINOPHEN 325 MG PO TABS
650.0000 mg | ORAL_TABLET | ORAL | Status: DC | PRN
Start: 2014-02-08 — End: 2014-02-09

## 2014-02-08 MED ORDER — MEPERIDINE HCL 25 MG/ML IJ SOLN: 6.2500 mg | INTRAMUSCULAR | Status: DC | PRN

## 2014-02-08 MED ORDER — HYDROCORTISONE 1 % EX CREA
TOPICAL_CREAM | Freq: Two times a day (BID) | CUTANEOUS | Status: DC
  Administered 2014-02-08 (×2): via TOPICAL
  Filled 2014-02-08: qty 28

## 2014-02-08 MED ORDER — SODIUM CHLORIDE 0.9 % IJ SOLN
3.0000 mL | Freq: Two times a day (BID) | INTRAMUSCULAR | Status: DC
  Administered 2014-02-08: 3 mL via INTRAVENOUS

## 2014-02-08 MED ORDER — FENTANYL CITRATE 0.05 MG/ML IJ SOLN: 25.0000 ug | INTRAMUSCULAR | Status: DC | PRN

## 2014-02-08 MED ORDER — SODIUM CHLORIDE 0.9 % IV SOLN
250.0000 mL | INTRAVENOUS | Status: DC
  Administered 2014-02-08: 250 mL via INTRAVENOUS

## 2014-02-08 MED ORDER — ZOLPIDEM TARTRATE 5 MG PO TABS: 5.0000 mg | ORAL_TABLET | Freq: Every evening | ORAL | Status: DC | PRN

## 2014-02-08 NOTE — Anesthesia Postprocedure Evaluation (Signed)
  Anesthesia Post-op Note  Patient: Edgar Hogan  Procedure(s) Performed: Procedure(s): CARDIOVERSION (N/A)  Patient Location: PACU  Anesthesia Type:General  Level of Consciousness: awake, alert  and oriented  Airway and Oxygen Therapy: Patient Spontanous Breathing and Patient connected to nasal cannula oxygen  Post-op Pain: none  Post-op Assessment: Post-op Vital signs reviewed, Patient's Cardiovascular Status Stable, Respiratory Function Stable, Patent Airway and No signs of Nausea or vomiting  Post-op Vital Signs: Reviewed and stable  Last Vitals:  Filed Vitals:   02/08/14 1007  BP: 121/80  Pulse: 70  Temp: 36.4 C  Resp: 20    Complications: No apparent anesthesia complications

## 2014-02-08 NOTE — Progress Notes (Signed)
Patient ID: Edgar Hogan, male   DOB: August 28, 1945, 68 y.o.   MRN: [id number]   SUBJECTIVE: Patient remains in atrial fibrillation.  No complaints this morning. ECG last night ok.   Scheduled Meds: . allopurinol  300 mg Oral Daily  . apixaban  5 mg Oral BID  . atorvastatin  40 mg Oral Daily  . dofetilide  250 mcg Oral BID  . furosemide  40 mg Oral Daily  . insulin aspart  0-9 Units Subcutaneous TID WC  . lisinopril  40 mg Oral Daily  . magnesium oxide  600 mg Oral BID  . metFORMIN  500 mg Oral BID WC  . metoprolol succinate  50 mg Oral BID  . potassium chloride SA  20 mEq Oral Daily  . sodium chloride  3 mL Intravenous Q12H  . sodium chloride  3 mL Intravenous Q12H  . temazepam  30 mg Oral QHS  . traZODone  150 mg Oral QHS  . Vortioxetine HBr  20 mg Oral QHS   Continuous Infusions: . sodium chloride     PRN Meds:.sodium chloride, sodium chloride, sodium chloride    Filed Vitals:   02/07/14 1025 02/07/14 1404 02/07/14 2052 02/08/14 0441  BP: 110/77 102/69 121/75 95/70  Pulse: 67 75 83 73  Temp:  98 F (36.7 C) 98.2 F (36.8 C) 97.7 F (36.5 C)  TempSrc:  Oral Oral Oral  Resp:  18 18 18   Height:      Weight:    248 lb 4.7 oz (112.624 kg)  SpO2:  96% 98% 97%    Intake/Output Summary (Last 24 hours) at 02/08/14 0718 Last data filed at 02/07/14 1700  Gross per 24 hour  Intake   1080 ml  Output   1550 ml  Net   -470 ml    LABS: Basic Metabolic Panel:  Recent Labs  02/07/14 0500 02/08/14 0340  NA 141 141  K 4.4 4.4  CL 101 101  CO2 27 28  GLUCOSE 107* 112*  BUN 24* 25*  CREATININE 1.16 1.10  CALCIUM 9.2 9.1  MG 2.0 2.0   Liver Function Tests: No results for input(s): AST, ALT, ALKPHOS, BILITOT, PROT, ALBUMIN in the last 72 hours. No results for input(s): LIPASE, AMYLASE in the last 72 hours. CBC: No results for input(s): WBC, NEUTROABS, HGB, HCT, MCV, PLT in the last 72 hours. Cardiac Enzymes: No results for input(s): CKTOTAL, CKMB, CKMBINDEX,  TROPONINI in the last 72 hours. BNP: Invalid input(s): POCBNP D-Dimer: No results for input(s): DDIMER in the last 72 hours. Hemoglobin A1C: No results for input(s): HGBA1C in the last 72 hours. Fasting Lipid Panel: No results for input(s): CHOL, HDL, LDLCALC, TRIG, CHOLHDL, LDLDIRECT in the last 72 hours. Thyroid Function Tests: No results for input(s): TSH, T4TOTAL, T3FREE, THYROIDAB in the last 72 hours.  Invalid input(s): FREET3 Anemia Panel: No results for input(s): VITAMINB12, FOLATE, FERRITIN, TIBC, IRON, RETICCTPCT in the last 72 hours.  RADIOLOGY: No results found.  PHYSICAL EXAM General: NAD Neck: No JVD, no thyromegaly or thyroid nodule.  Lungs: Clear to auscultation bilaterally with normal respiratory effort. CV: Nondisplaced PMI.  Heart regular S1/S2, no S3/S4, no murmur.  No peripheral edema.  No carotid bruit.  Normal pedal pulses.  Abdomen: Soft, nontender, no hepatosplenomegaly, no distention.  Neurologic: Alert and oriented x 3.  Psych: Normal affect. Extremities: No clubbing or cyanosis.   TELEMETRY: Reviewed telemetry pt in atrial fibrillation  ASSESSMENT AND PLAN: 68 yo with history of atrial fibrillation, DM2,  HTN is here for initiation of Tikosyn.  K and Mg are ok.  I decreased Tikosyn dose yesterday and QTc interval was ok after last dose.   - He will get a dose of Tikosyn this morning then will plan DCCV.  - He can go home after tomorrow am's dose.   [name] 02/08/2014 7:18 AM

## 2014-02-08 NOTE — Interval H&P Note (Signed)
History and Physical Interval Note:  02/08/2014 9:27 AM  Edgar Hogan  has presented today for surgery, with the diagnosis of a fib  The various methods of treatment have been discussed with the patient and family. After consideration of risks, benefits and other options for treatment, the patient has consented to  Procedure(s): CARDIOVERSION (N/A) as a surgical intervention .  The patient's history has been reviewed, patient examined, no change in status, stable for surgery.  I have reviewed the patient's chart and labs.  Questions were answered to the patient's satisfaction.     Christiaan Strebeck [employer]

## 2014-02-08 NOTE — Progress Notes (Signed)
Pt up ambulating in hallway and c/o ha; no orders for PO meds; will page MD and await callback.

## 2014-02-08 NOTE — H&P (View-Only) (Signed)
Patient ID: Edgar Hogan, male   DOB: [birth year], 68 y.o.   MRN: [id number]   SUBJECTIVE: Patient remains in atrial fibrillation.  No complaints this morning. ECG last night ok.   Scheduled Meds: . allopurinol  300 mg Oral Daily  . apixaban  5 mg Oral BID  . atorvastatin  40 mg Oral Daily  . dofetilide  250 mcg Oral BID  . furosemide  40 mg Oral Daily  . insulin aspart  0-9 Units Subcutaneous TID WC  . lisinopril  40 mg Oral Daily  . magnesium oxide  600 mg Oral BID  . metFORMIN  500 mg Oral BID WC  . metoprolol succinate  50 mg Oral BID  . potassium chloride SA  20 mEq Oral Daily  . sodium chloride  3 mL Intravenous Q12H  . sodium chloride  3 mL Intravenous Q12H  . temazepam  30 mg Oral QHS  . traZODone  150 mg Oral QHS  . Vortioxetine HBr  20 mg Oral QHS   Continuous Infusions: . sodium chloride     PRN Meds:.sodium chloride, sodium chloride, sodium chloride    Filed Vitals:   02/07/14 1025 02/07/14 1404 02/07/14 2052 02/08/14 0441  BP: 110/77 102/69 121/75 95/70  Pulse: 67 75 83 73  Temp:  98 F (36.7 C) 98.2 F (36.8 C) 97.7 F (36.5 C)  TempSrc:  Oral Oral Oral  Resp:  18 18 18   Height:      Weight:    248 lb 4.7 oz (112.624 kg)  SpO2:  96% 98% 97%    Intake/Output Summary (Last 24 hours) at 02/08/14 0718 Last data filed at 02/07/14 1700  Gross per 24 hour  Intake   1080 ml  Output   1550 ml  Net   -470 ml    LABS: Basic Metabolic Panel:  Recent Labs  02/07/14 0500 02/08/14 0340  NA 141 141  K 4.4 4.4  CL 101 101  CO2 27 28  GLUCOSE 107* 112*  BUN 24* 25*  CREATININE 1.16 1.10  CALCIUM 9.2 9.1  MG 2.0 2.0   Liver Function Tests: No results for input(s): AST, ALT, ALKPHOS, BILITOT, PROT, ALBUMIN in the last 72 hours. No results for input(s): LIPASE, AMYLASE in the last 72 hours. CBC: No results for input(s): WBC, NEUTROABS, HGB, HCT, MCV, PLT in the last 72 hours. Cardiac Enzymes: No results for input(s): CKTOTAL, CKMB, CKMBINDEX,  TROPONINI in the last 72 hours. BNP: Invalid input(s): POCBNP D-Dimer: No results for input(s): DDIMER in the last 72 hours. Hemoglobin A1C: No results for input(s): HGBA1C in the last 72 hours. Fasting Lipid Panel: No results for input(s): CHOL, HDL, LDLCALC, TRIG, CHOLHDL, LDLDIRECT in the last 72 hours. Thyroid Function Tests: No results for input(s): TSH, T4TOTAL, T3FREE, THYROIDAB in the last 72 hours.  Invalid input(s): FREET3 Anemia Panel: No results for input(s): VITAMINB12, FOLATE, FERRITIN, TIBC, IRON, RETICCTPCT in the last 72 hours.  RADIOLOGY: No results found.  PHYSICAL EXAM General: NAD Neck: No JVD, no thyromegaly or thyroid nodule.  Lungs: Clear to auscultation bilaterally with normal respiratory effort. CV: Nondisplaced PMI.  Heart regular S1/S2, no S3/S4, no murmur.  No peripheral edema.  No carotid bruit.  Normal pedal pulses.  Abdomen: Soft, nontender, no hepatosplenomegaly, no distention.  Neurologic: Alert and oriented x 3.  Psych: Normal affect. Extremities: No clubbing or cyanosis.   TELEMETRY: Reviewed telemetry pt in atrial fibrillation  ASSESSMENT AND PLAN: 68 yo with history of atrial fibrillation, DM2,  HTN is here for initiation of Tikosyn.  K and Mg are ok.  I decreased Tikosyn dose yesterday and QTc interval was ok after last dose.   - He will get a dose of Tikosyn this morning then will plan DCCV.  - He can go home after tomorrow am's dose.   [name] 02/08/2014 7:18 AM

## 2014-02-08 NOTE — Procedures (Signed)
Electrical Cardioversion Procedure Note Edgar Hogan [id number] 11-21-1945  Procedure: Electrical Cardioversion Indications:  Atrial Fibrillation.  He has been on Xarelto and Tikosyn.   Procedure Details Consent: Risks of procedure as well as the alternatives and risks of each were explained to the (patient/caregiver).  Consent for procedure obtained. Time Out: Verified patient identification, verified procedure, site/side was marked, verified correct patient position, special equipment/implants available, medications/allergies/relevent history reviewed, required imaging and test results available.  Performed  Patient placed on cardiac monitor, pulse oximetry, supplemental oxygen as necessary.  Sedation given: Propofol per anesthesiology Pacer pads placed anterior and posterior chest.  Cardioverted 1 time(s).  Cardioverted at [place].  Evaluation Findings: Post procedure EKG shows: NSR Complications: None Patient did tolerate procedure well.   Edgar Hogan 02/08/2014, 9:28 AM

## 2014-02-08 NOTE — Anesthesia Postprocedure Evaluation (Signed)
  Anesthesia Post-op Note  Patient: Edgar Hogan  Procedure(s) Performed: Procedure(s): CARDIOVERSION (N/A)  Patient Location: PACU and Endoscopy Unit  Anesthesia Type:General  Level of Consciousness: awake, alert , oriented and patient cooperative  Airway and Oxygen Therapy: Patient Spontanous Breathing and Patient connected to nasal cannula oxygen  Post-op Pain: none  Post-op Assessment: Post-op Vital signs reviewed, Patient's Cardiovascular Status Stable, Respiratory Function Stable, Patent Airway and No signs of Nausea or vomiting  Post-op Vital Signs: Reviewed and stable  Last Vitals:  Filed Vitals:   02/08/14 0848  BP: 140/96  Pulse: 68  Temp:   Resp: 15    Complications: No apparent anesthesia complications

## 2014-02-08 NOTE — Transfer of Care (Signed)
Immediate Anesthesia Transfer of Care Note  Patient: Edgar Hogan  Procedure(s) Performed: Procedure(s): CARDIOVERSION (N/A)  Patient Location: PACU and Endoscopy Unit  Anesthesia Type:General  Level of Consciousness: awake, alert , oriented and patient cooperative  Airway & Oxygen Therapy: Patient Spontanous Breathing and Patient connected to nasal cannula oxygen  Post-op Assessment: Report given to PACU RN, Post -op Vital signs reviewed and stable and Patient moving all extremities  Post vital signs: Reviewed and stable  Complications: No apparent anesthesia complications

## 2014-02-08 NOTE — Plan of Care (Signed)
Problem: Phase II Progression Outcomes Goal: Ventricular heart rate < 100/min Outcome: Completed/Met Date Met:  02/08/14 Goal: Anticoagulation Therapy per MD order Outcome: Completed/Met Date Met:  02/08/14 Goal: Pain controlled Outcome: Completed/Met Date Met:  02/08/14 Goal: Progress activity as tolerated unless otherwise ordered Outcome: Completed/Met Date Met:  02/08/14

## 2014-02-08 NOTE — Anesthesia Preprocedure Evaluation (Addendum)
Anesthesia Evaluation  Patient identified by MRN, date of birth, ID band Patient awake    Reviewed: Allergy & Precautions, H&P , Patient's Chart, lab work & pertinent test results, reviewed documented beta blocker date and time   Airway Mallampati: II  TM Distance: >3 FB Neck ROM: Full    Dental  (+) Teeth Intact   Pulmonary sleep apnea , former smoker,          Cardiovascular hypertension, Pt. on home beta blockers and Pt. on medications + dysrhythmias Atrial Fibrillation  EF 55% ECHO 09/2013   Neuro/Psych Depression    GI/Hepatic GERD-  Controlled and Medicated,  Endo/Other  diabetes, Well Controlled, Type 2  Renal/GU      Musculoskeletal   Abdominal   Peds  Hematology   Anesthesia Other Findings Beard  Reproductive/Obstetrics                            Anesthesia Physical Anesthesia Plan  ASA: III  Anesthesia Plan: General   Post-op Pain Management:    Induction:   Airway Management Planned: Nasal Cannula  Additional Equipment:   Intra-op Plan:   Post-operative Plan:   Informed Consent: I have reviewed the patients History and Physical, chart, labs and discussed the procedure including the risks, benefits and alternatives for the proposed anesthesia with the patient or authorized representative who has indicated his/her understanding and acceptance.     Plan Discussed with:   Anesthesia Plan Comments:         Anesthesia Quick Evaluation

## 2014-02-09 ENCOUNTER — Other Ambulatory Visit: Payer: Self-pay

## 2014-02-09 ENCOUNTER — Encounter (HOSPITAL_COMMUNITY): Payer: Self-pay | Admitting: Physician Assistant

## 2014-02-09 ENCOUNTER — Telehealth: Payer: Self-pay | Admitting: Cardiology

## 2014-02-09 DIAGNOSIS — E785 Hyperlipidemia, unspecified: Secondary | ICD-10-CM | POA: Diagnosis present

## 2014-02-09 DIAGNOSIS — I480 Paroxysmal atrial fibrillation: Principal | ICD-10-CM

## 2014-02-09 DIAGNOSIS — K219 Gastro-esophageal reflux disease without esophagitis: Secondary | ICD-10-CM | POA: Diagnosis present

## 2014-02-09 LAB — MAGNESIUM: Magnesium: 1.9 mg/dL (ref 1.5–2.5)

## 2014-02-09 LAB — BASIC METABOLIC PANEL
Anion gap: 14 (ref 5–15)
BUN: 24 mg/dL — AB (ref 6–23)
CHLORIDE: 101 meq/L (ref 96–112)
CO2: 23 meq/L (ref 19–32)
Calcium: 9 mg/dL (ref 8.4–10.5)
Creatinine, Ser: 0.93 mg/dL (ref 0.50–1.35)
GFR calc Af Amer: >90 mL/min (ref >90)
GFR calc non Af Amer: 86 mL/min — ABNORMAL LOW (ref >90)
GLUCOSE: 122 mg/dL — AB (ref 70–99)
POTASSIUM: 4.2 meq/L (ref 3.7–5.3)
Sodium: 138 mEq/L (ref 137–147)

## 2014-02-09 LAB — GLUCOSE, CAPILLARY: Glucose-Capillary: 146 mg/dL — ABNORMAL HIGH (ref 70–99)

## 2014-02-09 MED ORDER — DOFETILIDE 250 MCG PO CAPS: 250.0000 ug | ORAL_CAPSULE | Freq: Two times a day (BID) | ORAL | Status: AC

## 2014-02-09 NOTE — Plan of Care (Signed)
Problem: Phase II Progression Outcomes Goal: Discharge plan established Outcome: Completed/Met Date Met:  02/09/14 Goal: Tolerating diet Outcome: Completed/Met Date Met:  02/09/14

## 2014-02-09 NOTE — Discharge Summary (Signed)
Discharge Summary   Patient ID: Edgar Hogan MRN: [id number], DOB/AGE: 1945-03-15 68 y.o. Admit date: 02/06/2014 D/C date:     02/09/2014  Primary Cardiologist: Dr. Aundra Dubin  Principal Problem:   Atrial fibrillation Active Problems:   CAD (coronary artery disease)   OSA (obstructive sleep apnea)   Diabetes   Essential hypertension   Premature ventricular contraction   Chronic systolic CHF (congestive heart failure)   H/O: gout   HLD (hyperlipidemia)   GERD (gastroesophageal reflux disease)    Admission Dates: 02/06/14-02/09/14 Discharge Diagnosis: atrial fibrillation s/p admission for Tikosyn load and DCCV (02/08/14).   HPI: Edgar Hogan is a 68 y.o. male with a history of non obst CAD, PAF on Eliquis, OSA, DM2, HLD, gout, NICM (EF 40-45%--> 50-55%) and HTN who presented to [facility] on 02/06/14 for initiation of Tikosyn and planned DCCV.  Patient initially went into atrial fibrillation in 05/2013 after undergoing general anaesthesia for CTA (patient gets claustrophobic). He was placed on Toprol XL and Eliquis. ECHO in 3/15showed EF 40-45% with diffuse hypokinesis. He had volume overload and was started on Lasix. In 3/15, Dr. Aundra Dubin did a TEE-guided cardioversion with successful conversion to NSR.TEE confirmed EF about 40-45%. LHC showed EF 45% with nonobstructive coronary disease. After cardioversion, patient continued to feel palpitations.2 week monitor in 4/15 showed PVCs but no atrial fibrillation. Repeat echo in 9/15 showed EF 50-55%, mild LVH, mild MR. He was seen in the office on 01/14/14 and found to be in back in atrial fibrillation and reported increased fatigue.It was decided to admit him for Tikosyn loading and proceed with DCCV at [facility]. He has not missed any Eliquis doses.    Hospital Course  PAF- s/p successful Tiksoyn loading and DCCV on 02/08/14 and now maintaining NSR -- Tikosyn was initiated after making sure his K > 4 and Mag >1.8. He required some Mag supplementation  upon admission -- Initially he was started on Tikosyn 572mcg BID, but his QTc was noted to be slightly prolonged and it was decreased to 210mcg  -- His QTc remained within safe limits on this new dosage and patient is in NSR this morning after cardioversion 02/08/14.Post-DCCV ECG showed QTc 463 msec.  -- He will go home on 250 mcg BID, Eliquis 5mg  BID and Toprol XL 50mg  BID  The patient has had an uncomplicated hospital course and is recovering well. He has been seen by Dr. Aundra Dubin today and deemed ready for discharge home. A staff message has been sent for an appointment with pharmacy in 1 week and Dr. Aundra Dubin in 2 weeks (computer system is down and I am unable to make appointments). Smoking cessation was disscussed in length. A written RX for a 7 day supply of Tikosyn was provided for the patient. Discharge medications are listed below.   Discharge Vitals: Blood pressure 104/66, pulse 57, temperature 97.9 F (36.6 C), temperature source Oral, resp. rate 18, height 6\' 2"  (1.88 m), weight 249 lb 1.9 oz (113 kg), SpO2 97 %.  Labs: Lab Results  Component Value Date   WBC 13.5* 01/12/2014   HGB 13.9 01/12/2014   HCT 42.6 01/12/2014   MCV 93.3 01/12/2014   PLT 229.0 01/12/2014     Recent Labs Lab 02/09/14 0353  NA 138  K 4.2  CL 101  CO2 23  BUN 24*  CREATININE 0.93  CALCIUM 9.0  GLUCOSE 122*    Lab Results  Component Value Date   CHOL 192 01/12/2014   HDL 33.70* 01/12/2014  TRIG * 01/12/2014    458.0 Triglyceride is over 400; calculations on Lipids are invalid.     Diagnostic Studies/Procedures   No results found.  Discharge Medications     Medication List    TAKE these medications        allopurinol 300 MG tablet  Commonly known as:  ZYLOPRIM  Take 300 mg by mouth daily.     atorvastatin 40 MG tablet  Commonly known as:  LIPITOR  Take 40 mg by mouth daily.     BRINTELLIX 20 MG Tabs  Generic drug:  Vortioxetine HBr  Take 1 tablet by mouth at bedtime.      dofetilide 250 MCG capsule  Commonly known as:  TIKOSYN  Take 1 capsule (250 mcg total) by mouth 2 (two) times daily.     ELIQUIS 5 MG Tabs tablet  Generic drug:  apixaban  TAKE 1 TABLET BY MOUTH TWICE DAILY     furosemide 40 MG tablet  Commonly known as:  LASIX  TAKE 1 TABLET BY MOUTH EVERY DAY     lisinopril 40 MG tablet  Commonly known as:  PRINIVIL,ZESTRIL  Take 1 tablet by mouth daily.     Magnesium Oxide 400 (240 MG) MG Tabs  Take 400 mg by mouth 2 (two) times daily.     metFORMIN 500 MG tablet  Commonly known as:  GLUCOPHAGE  Take 500 mg by mouth 2 (two) times daily with a meal.     metoprolol succinate 50 MG 24 hr tablet  Commonly known as:  TOPROL XL  Take 1 tablet (50 mg total) by mouth 2 (two) times daily.     MULTIVITAMIN PO  Take 1 tablet by mouth daily.     omeprazole 40 MG capsule  Commonly known as:  PRILOSEC  Take 40 mg by mouth 2 (two) times daily.     potassium chloride SA 20 MEQ tablet  Commonly known as:  K-DUR,KLOR-CON  TAKE 1 TABLET BY MOUTH EVERY DAY     temazepam 30 MG capsule  Commonly known as:  RESTORIL  Take 30 mg by mouth at bedtime.     traZODone 150 MG tablet  Commonly known as:  DESYREL  Take 150 mg by mouth at bedtime.        Disposition   The patient will be discharged in stable condition to home.  Follow-up Information    Follow up with [place].   Why:  The office will call you to make an appoinment., If you do not hear from them, please contact them., You should be seen within 1-2 weeks.   Contact information:   [place] [phone]      Follow up with [name], MD.   Specialty:  Cardiology   Why:  The office will call you to make an appoinment. , If you do not hear from them, please contact them., You should be seen in 2 weeks   Contact information:   [address]  [address] [phone]         Duration of Discharge Encounter: Greater than 30 minutes including physician and PA time.  Mable Fill R PA-C 02/09/2014, 9:32 AM

## 2014-02-09 NOTE — Progress Notes (Signed)
Patient ID: Edgar Hogan, male   DOB: 1945/11/16, 68 y.o.   MRN: [id number]   SUBJECTIVE: Patient is in NSR this morning after cardioversion yesterday.  Post-DCCV ECG showed QTc 463 msec.   Scheduled Meds: . allopurinol  300 mg Oral Daily  . apixaban  5 mg Oral BID  . atorvastatin  40 mg Oral Daily  . dofetilide  250 mcg Oral BID  . furosemide  40 mg Oral Daily  . hydrocortisone cream   Topical BID  . insulin aspart  0-9 Units Subcutaneous TID WC  . lisinopril  40 mg Oral Daily  . magnesium oxide  600 mg Oral BID  . metFORMIN  500 mg Oral BID WC  . metoprolol succinate  50 mg Oral BID  . potassium chloride SA  20 mEq Oral Daily  . sodium chloride  3 mL Intravenous Q12H  . sodium chloride  3 mL Intravenous Q12H  . temazepam  30 mg Oral QHS  . traZODone  150 mg Oral QHS  . Vortioxetine HBr  20 mg Oral QHS   Continuous Infusions: . sodium chloride Stopped (02/08/14 0812)   PRN Meds:.sodium chloride, acetaminophen, ALPRAZolam, fentaNYL, meperidine (DEMEROL) injection, ondansetron (ZOFRAN) IV, promethazine, sodium chloride, sodium chloride, traMADol, zolpidem    Filed Vitals:   02/08/14 1023 02/08/14 1341 02/08/14 2038 02/09/14 0532  BP: 123/81 100/63 125/84 104/66  Pulse: 62 61 65 57  Temp:  97.8 F (36.6 C) 97.9 F (36.6 C) 97.9 F (36.6 C)  TempSrc:  Oral Oral Oral  Resp:  18 18 18   Height:      Weight:    249 lb 1.9 oz (113 kg)  SpO2:  100% 98% 97%    Intake/Output Summary (Last 24 hours) at 02/09/14 0708 Last data filed at 02/08/14 2039  Gross per 24 hour  Intake   1380 ml  Output   2101 ml  Net   -721 ml    LABS: Basic Metabolic Panel:  Recent Labs  02/08/14 0340 02/09/14 0353  NA 141 138  K 4.4 4.2  CL 101 101  CO2 28 23  GLUCOSE 112* 122*  BUN 25* 24*  CREATININE 1.10 0.93  CALCIUM 9.1 9.0  MG 2.0 1.9   Liver Function Tests: No results for input(s): AST, ALT, ALKPHOS, BILITOT, PROT, ALBUMIN in the last 72 hours. No results for input(s):  LIPASE, AMYLASE in the last 72 hours. CBC: No results for input(s): WBC, NEUTROABS, HGB, HCT, MCV, PLT in the last 72 hours. Cardiac Enzymes: No results for input(s): CKTOTAL, CKMB, CKMBINDEX, TROPONINI in the last 72 hours. BNP: Invalid input(s): POCBNP D-Dimer: No results for input(s): DDIMER in the last 72 hours. Hemoglobin A1C: No results for input(s): HGBA1C in the last 72 hours. Fasting Lipid Panel: No results for input(s): CHOL, HDL, LDLCALC, TRIG, CHOLHDL, LDLDIRECT in the last 72 hours. Thyroid Function Tests: No results for input(s): TSH, T4TOTAL, T3FREE, THYROIDAB in the last 72 hours.  Invalid input(s): FREET3 Anemia Panel: No results for input(s): VITAMINB12, FOLATE, FERRITIN, TIBC, IRON, RETICCTPCT in the last 72 hours.  RADIOLOGY: No results found.  PHYSICAL EXAM General: NAD Neck: No JVD, no thyromegaly or thyroid nodule.  Lungs: Clear to auscultation bilaterally with normal respiratory effort. CV: Nondisplaced PMI.  Heart regular S1/S2, no S3/S4, no murmur.  No peripheral edema.  No carotid bruit.  Normal pedal pulses.  Abdomen: Soft, nontender, no hepatosplenomegaly, no distention.  Neurologic: Alert and oriented x 3.  Psych: Normal affect. Extremities: No clubbing  or cyanosis.   TELEMETRY: Reviewed telemetry pt in atrial fibrillation  ASSESSMENT AND PLAN: 68 yo with history of atrial fibrillation, DM2, HTN is here for initiation of Tikosyn.  He was cardioverted to NSR yesterday.  He is now on Tikosyn 250 mcg bid with stable QT interval. - He may go home today after his am ECG. - He will continue home meds with addition of Tikosyn 250 mcg bid.   - Followup with me in 2 wks.    [name] 02/09/2014 7:08 AM

## 2014-02-09 NOTE — Progress Notes (Signed)
12 lead EKG did not transfer to record, current QTC 445.  MD okayed AM dose of Tikosyn.  Claudette Stapler, RN

## 2014-02-13 ENCOUNTER — Other Ambulatory Visit (INDEPENDENT_AMBULATORY_CARE_PROVIDER_SITE_OTHER): Payer: BC Managed Care – PPO | Admitting: *Deleted

## 2014-02-13 DIAGNOSIS — I480 Paroxysmal atrial fibrillation: Secondary | ICD-10-CM

## 2014-02-13 DIAGNOSIS — I5032 Chronic diastolic (congestive) heart failure: Secondary | ICD-10-CM

## 2014-02-13 DIAGNOSIS — I10 Essential (primary) hypertension: Secondary | ICD-10-CM

## 2014-02-13 LAB — LIPID PANEL
CHOL/HDL RATIO: 6
Cholesterol: 189 mg/dL (ref 0–200)
HDL: 32.2 mg/dL — ABNORMAL LOW (ref >39.00)
NONHDL: 156.8
Triglycerides: 522 mg/dL — ABNORMAL HIGH (ref 0.0–149.0)
VLDL: 104.4 mg/dL — ABNORMAL HIGH (ref 0.0–40.0)

## 2014-02-13 LAB — LDL CHOLESTEROL, DIRECT: Direct LDL: 76.7 mg/dL

## 2014-02-13 NOTE — Telephone Encounter (Signed)
New message     Talk to Gay Filler only----have an appt this fri with you but he says this is not about his appt.

## 2014-02-14 NOTE — Telephone Encounter (Signed)
Pt called to report that he was having problems with getting his Tikosyn from [facility].  They did not have his dose in stock.  He was able to call [facility] and get the prescription filled today with no issues.

## 2014-02-15 ENCOUNTER — Telehealth: Payer: Self-pay | Admitting: Cardiology

## 2014-02-15 ENCOUNTER — Ambulatory Visit (INDEPENDENT_AMBULATORY_CARE_PROVIDER_SITE_OTHER): Payer: BC Managed Care – PPO | Admitting: Internal Medicine

## 2014-02-15 ENCOUNTER — Encounter (HOSPITAL_COMMUNITY): Payer: Self-pay | Admitting: Cardiology

## 2014-02-15 ENCOUNTER — Other Ambulatory Visit (INDEPENDENT_AMBULATORY_CARE_PROVIDER_SITE_OTHER): Payer: BC Managed Care – PPO | Admitting: *Deleted

## 2014-02-15 VITALS — BP 124/90 | HR 61 | Ht 74.0 in | Wt 251.8 lb

## 2014-02-15 DIAGNOSIS — I2510 Atherosclerotic heart disease of native coronary artery without angina pectoris: Secondary | ICD-10-CM

## 2014-02-15 DIAGNOSIS — I4891 Unspecified atrial fibrillation: Secondary | ICD-10-CM

## 2014-02-15 DIAGNOSIS — R002 Palpitations: Secondary | ICD-10-CM

## 2014-02-15 NOTE — Telephone Encounter (Signed)
Returned patient call. Patient is light headed and feels his heart racing in his neck. Patient's HR 74 BP 146/94. Talked to the DOD, Dr. Harrington Challenger, she wants patient to be added to her scheduled for today. Patient advise to have wife bring him to the office to see Dr. Harrington Challenger. Ordered and scheduled a BMET, per Dr. Harrington Challenger.

## 2014-02-15 NOTE — Telephone Encounter (Signed)
New message      Started tikosyn last week, today feeling lightheaded and heart racing.  Please advise

## 2014-02-16 ENCOUNTER — Ambulatory Visit (INDEPENDENT_AMBULATORY_CARE_PROVIDER_SITE_OTHER): Payer: BC Managed Care – PPO | Admitting: Pharmacist

## 2014-02-16 DIAGNOSIS — I4891 Unspecified atrial fibrillation: Secondary | ICD-10-CM

## 2014-02-16 LAB — BASIC METABOLIC PANEL
BUN: 22 mg/dL (ref 6–23)
CALCIUM: 9.6 mg/dL (ref 8.4–10.5)
CO2: 26 mEq/L (ref 19–32)
Chloride: 105 mEq/L (ref 96–112)
Creatinine, Ser: 1 mg/dL (ref 0.4–1.5)
GFR: 80.19 mL/min (ref >60.00)
Glucose, Bld: 94 mg/dL (ref 70–99)
POTASSIUM: 4.4 meq/L (ref 3.5–5.1)
Sodium: 137 mEq/L (ref 135–145)

## 2014-02-16 NOTE — Progress Notes (Signed)
HPI Patinet is a 68 yo with a history of PAF, HTN, DM  He called in today complaining of feeling his heart beat hard, midl lightheadedness Concerned since on tikosyn.   No Known Allergies  Current Outpatient Prescriptions  Medication Sig Dispense Refill  . allopurinol (ZYLOPRIM) 300 MG tablet Take 300 mg by mouth daily.    Marland Kitchen atorvastatin (LIPITOR) 40 MG tablet Take 40 mg by mouth daily.    Marland Kitchen BRINTELLIX 20 MG TABS Take 1 tablet by mouth at bedtime.   1  . dofetilide (TIKOSYN) 250 MCG capsule Take 1 capsule (250 mcg total) by mouth 2 (two) times daily. 60 capsule 11  . ELIQUIS 5 MG TABS tablet TAKE 1 TABLET BY MOUTH TWICE DAILY 60 tablet 3  . furosemide (LASIX) 40 MG tablet TAKE 1 TABLET BY MOUTH EVERY DAY 30 tablet 11  . lisinopril (PRINIVIL,ZESTRIL) 40 MG tablet Take 1 tablet by mouth daily.  3  . Magnesium Oxide 400 (240 MG) MG TABS Take 400 mg by mouth 2 (two) times daily. 60 tablet 6  . metFORMIN (GLUCOPHAGE) 500 MG tablet Take 500 mg by mouth 2 (two) times daily with a meal.    . metoprolol succinate (TOPROL XL) 50 MG 24 hr tablet Take 1 tablet (50 mg total) by mouth 2 (two) times daily. 180 tablet 3  . Multiple Vitamins-Minerals (MULTIVITAMIN PO) Take 1 tablet by mouth daily.    Marland Kitchen omeprazole (PRILOSEC) 40 MG capsule Take 40 mg by mouth 2 (two) times daily.    . potassium chloride SA (K-DUR,KLOR-CON) 20 MEQ tablet TAKE 1 TABLET BY MOUTH EVERY DAY 30 tablet 11  . temazepam (RESTORIL) 30 MG capsule Take 30 mg by mouth at bedtime.    . traZODone (DESYREL) 150 MG tablet Take 150 mg by mouth at bedtime.     No current facility-administered medications for this visit.    Past Medical History  Diagnosis Date  . Atrial fibrillation     a. s/p tikosyn load and DCCV 02/2014  . HLD (hyperlipidemia)   . Unspecified essential hypertension   . CHF (congestive heart failure)   . Basal cell carcinoma   . History of gout   . OSA (obstructive sleep apnea)     a. not on CPAP  . Type II diabetes  mellitus   . GERD (gastroesophageal reflux disease)   . CAD (coronary artery disease)     a. non obstructive by cath 05/2013    Past Surgical History  Procedure Laterality Date  . Radiology with anesthesia N/A 05/11/2013    Procedure: RADIOLOGY WITH ANESTHESIA-CT;  Surgeon: Medication Radiologist, MD;  Location: [place];  Service: Radiology;  Laterality: N/A;  . Tee without cardioversion N/A 05/26/2013    Procedure: TRANSESOPHAGEAL ECHOCARDIOGRAM (TEE);  Surgeon: Larey Dresser, MD;  Location: [place];  Service: Cardiovascular;  Laterality: N/A;  . Cardioversion N/A 05/26/2013    Procedure: CARDIOVERSION;  Surgeon: Larey Dresser, MD;  Location: [place];  Service: Cardiovascular;  Laterality: N/A;  . Tonsillectomy  ~ 1960  . Inguinal hernia repair  ~ 2005  . Excisional hemorrhoidectomy  ~ 2003  . Wrist fusion Right ~ 2003  . Shoulder open rotator cuff repair Right 2006  . Knee arthroscopy Right ~ 2009  . Cardiac catheterization  ~ 2010; 2015  . Refractive surgery Bilateral ~ 2005  . Nasal septum surgery  1976  . Mohs surgery  ~ 2010    "nose"  . Cardioversion N/A 02/08/2014  Procedure: CARDIOVERSION;  Surgeon: Larey Dresser, MD;  Location: [facility] ENDOSCOPY;  Service: Cardiovascular;  Laterality: N/A;  . Left heart catheterization with coronary angiogram N/A 05/18/2013    Procedure: LEFT HEART CATHETERIZATION WITH CORONARY ANGIOGRAM;  Surgeon: Larey Dresser, MD;  Location: [facility] CATH LAB;  Service: Cardiovascular;  Laterality: N/A;    Family History  Problem Relation Age of Onset  . CAD      family history  . Heart attack Mother   . Heart attack Father     History   Social History  . Marital Status: Married    Spouse Name: N/A    Number of Children: N/A  . Years of Education: N/A   Occupational History  . Not on file.   Social History Main Topics  . Smoking status: Former Smoker -- 2.00 packs/day for 18 years    Types: Cigarettes, Cigars  . Smokeless tobacco:  Never Used     Comment: quit smoking cigarettes in 1983  . Alcohol Use: 3.6 oz/week    6 Cans of beer per week  . Drug Use: No  . Sexual Activity: Yes   Other Topics Concern  . Not on file   Social History Narrative    Review of Systems:  All systems reviewed.  They are negative to the above problem except as previously stated.  Vital Signs: BP 124/90 mmHg  Pulse 61  Ht 6\' 2"  (1.88 m)  Wt 251 lb 12.8 oz (114.216 kg)  BMI 32.32 kg/m2  Physical Exam patinet is in NAD HEENT:  Normocephalic, atraumatic. EOMI, PERRLA.  Neck: JVP is normal.  No bruits.  Lungs: clear to auscultation. No rales no wheezes.  Heart: Regular rate and rhythm. Normal S1, S2. No S3.   No significant murmurs. PMI not displaced.  Abdomen:  Supple, nontender. Normal bowel sounds. No masses. No hepatomegaly.  Extremities:   Good distal pulses throughout. No lower extremity edema.  Musculoskeletal :moving all extremities.  Neuro:   alert and oriented x3.  CN II-XII grossly intact.  EKG  SR 61  QTc440 msec Assessment and Plan:  1.  Dizziness.  Patien'ts BP , EKG and exam are unremarkable  Labs drawn.  Reassured. WIll folllow closely  2.  PAF  Continue meds

## 2014-02-19 ENCOUNTER — Other Ambulatory Visit (INDEPENDENT_AMBULATORY_CARE_PROVIDER_SITE_OTHER): Payer: BC Managed Care – PPO

## 2014-02-19 DIAGNOSIS — I4891 Unspecified atrial fibrillation: Secondary | ICD-10-CM

## 2014-02-19 LAB — MAGNESIUM: Magnesium: 1.6 mg/dL (ref 1.5–2.5)

## 2014-02-19 NOTE — Assessment & Plan Note (Signed)
[name] tolerating Tiksoyn well.  He did have one episode yesterday but EKG and labs normal.  Will add on Mg to his BMET yesterday so he does not have to get [place].  Plan to continue Tikosyn 286mcg BID and follow up with Truitt Merle in January and Dr. Aundra Dubin in March.

## 2014-02-19 NOTE — Progress Notes (Signed)
HPI  [name] is a 68 yo M pt of Dr. Aundra Dubin and Dr. Rayann Heman who has a history of paroxysmal afib, OSA, and DM.  He was first diagnosed with atrial fibrillation in March 2015 after undergoing CTA under general anesthesia.  He was started on low dose metoprolol and Eliquis at that time and underwent successful TEE/DCCV.   Unfortunately after the cardioversion, he continued to have palpitations and a 2 week monitor in April showed PVCs but no atrial fibrillation.   Dr. Rayann Heman saw him in consult in May.  Given he only had 1 documented episode of atrial fibrillation, he elected to continue to watch before starting AAD therapy.   At his visit with Dr. Aundra Dubin in November he was back in atrial fibrillation so decision was made to start Tikosyn at that time.     Pt was admitted for Tikosyn on 02/06/14.  He was started on 553mcg BID but required a dose titration down to 248mcg BID due to QTc prolongation.  He did not convert to NSR with Tikosyn alone so underwent DCCV on 12/3 and has been maintaining NSR since.  QTc on discharge was 433msec.   Pt called the office on 12/10 complaining of lightheadedness and heart racing.  He was seen by Dr. Harrington Challenger (DOD).  Exam was normal.  EKG showed NSR with ventricular rate of 68 bpm and QTc 461 msec.  No changes were made.  Pt states he feels better today.  He has not missed any doses of his Tikosyn since discharge.   Current Outpatient Prescriptions on File Prior to Visit  Medication Sig Dispense Refill  . allopurinol (ZYLOPRIM) 300 MG tablet Take 300 mg by mouth daily.    Marland Kitchen atorvastatin (LIPITOR) 40 MG tablet Take 40 mg by mouth daily.    Marland Kitchen BRINTELLIX 20 MG TABS Take 1 tablet by mouth at bedtime.   1  . dofetilide (TIKOSYN) 250 MCG capsule Take 1 capsule (250 mcg total) by mouth 2 (two) times daily. 60 capsule 11  . ELIQUIS 5 MG TABS tablet TAKE 1 TABLET BY MOUTH TWICE DAILY 60 tablet 3  . furosemide (LASIX) 40 MG tablet TAKE 1 TABLET BY MOUTH EVERY DAY 30 tablet 11  .  lisinopril (PRINIVIL,ZESTRIL) 40 MG tablet Take 1 tablet by mouth daily.  3  . Magnesium Oxide 400 (240 MG) MG TABS Take 400 mg by mouth 2 (two) times daily. 60 tablet 6  . metFORMIN (GLUCOPHAGE) 500 MG tablet Take 500 mg by mouth 2 (two) times daily with a meal.    . metoprolol succinate (TOPROL XL) 50 MG 24 hr tablet Take 1 tablet (50 mg total) by mouth 2 (two) times daily. 180 tablet 3  . Multiple Vitamins-Minerals (MULTIVITAMIN PO) Take 1 tablet by mouth daily.    Marland Kitchen omeprazole (PRILOSEC) 40 MG capsule Take 40 mg by mouth 2 (two) times daily.    . potassium chloride SA (K-DUR,KLOR-CON) 20 MEQ tablet TAKE 1 TABLET BY MOUTH EVERY DAY 30 tablet 11  . temazepam (RESTORIL) 30 MG capsule Take 30 mg by mouth at bedtime.    . traZODone (DESYREL) 150 MG tablet Take 150 mg by mouth at bedtime.     No current facility-administered medications on file prior to visit.   No Known Allergies

## 2014-02-22 ENCOUNTER — Other Ambulatory Visit: Payer: Self-pay | Admitting: *Deleted

## 2014-02-22 DIAGNOSIS — E785 Hyperlipidemia, unspecified: Secondary | ICD-10-CM

## 2014-02-22 MED ORDER — FENOFIBRATE 48 MG PO TABS: 48.0000 mg | ORAL_TABLET | Freq: Every day | ORAL | Status: DC

## 2014-02-23 ENCOUNTER — Telehealth: Payer: Self-pay | Admitting: Cardiology

## 2014-02-23 MED ORDER — FENOFIBRATE 145 MG PO TABS: 145.0000 mg | ORAL_TABLET | Freq: Every day | ORAL | Status: DC

## 2014-02-23 NOTE — Telephone Encounter (Signed)
Dr Aundra Dubin recommended pt start fenofibrate 48mg  daily. Pt states he has been taking fenofibrate 48mg  daily for about 3-4 months-(it was not on his medication list).  I will forward to Dr Aundra Dubin for review.

## 2014-02-23 NOTE — Telephone Encounter (Signed)
Increase fenofibrate to 145 mg daily.

## 2014-02-23 NOTE — Telephone Encounter (Signed)
Pt advised,verbalized understanding. 

## 2014-02-23 NOTE — Telephone Encounter (Signed)
LMTCB

## 2014-02-23 NOTE — Telephone Encounter (Signed)
Pt rtn call to anne pls call [phone]

## 2014-02-28 ENCOUNTER — Encounter: Payer: Self-pay | Admitting: Neurology

## 2014-02-28 ENCOUNTER — Ambulatory Visit (INDEPENDENT_AMBULATORY_CARE_PROVIDER_SITE_OTHER): Payer: BC Managed Care – PPO | Admitting: Neurology

## 2014-02-28 VITALS — BP 110/70 | HR 89 | Ht 74.0 in | Wt 252.0 lb

## 2014-02-28 DIAGNOSIS — F329 Major depressive disorder, single episode, unspecified: Secondary | ICD-10-CM

## 2014-02-28 DIAGNOSIS — F32A Depression, unspecified: Secondary | ICD-10-CM

## 2014-02-28 DIAGNOSIS — R413 Other amnesia: Secondary | ICD-10-CM

## 2014-02-28 NOTE — Progress Notes (Signed)
NEUROLOGY CONSULTATION NOTE  Edgar Hogan MRN: [id number] DOB: 1945-03-27  Referring provider: Dr. Anastasia Pall Primary care provider: Dr. Anastasia Pall  Reason for consult:  Memory difficulty  Dear Dr Melford Aase:  Thank you for your kind referral of Edgar Hogan for consultation of the above symptoms. Although his history is well known to you, please allow me to reiterate it for the purpose of our medical record. Records and images were personally reviewed where available.  HISTORY OF PRESENT ILLNESS: This is a very pleasant 68 year old left-hand dominant man with a history of hypertension, atrial fibrillation, diabetes, depression, presenting for word-finding difficulties. He states that he used to work as an Forensic psychologist, his job was explaining complicated legal or investment solutions. He reports that he had similar symptoms 9 years ago while living in [place]. He was evaluated at [facility], and after a battery of tests, including MRI brain and neuropsychological evaluation, he states that symptoms were due to stress. He notes that he has been having difficulties at home, he was under extreme stress for those 3 years, and was started on an unrecalled antidepressant that caused strong sexual side effects. The word-finding difficulties mostly improved, he briefly did psychotherapy and is now on Brintellix. He and his family moved to [place] 4 years ago. Recently, he started having difficulties again with dealing with his home situation, and states his stress is way back up again. He again started noticing trouble getting his words out and having lapses in memory. His children mostly make fun of him, and "this kind of hurts me."  He has missed some bill payments over the past 6 months. He occasionally forgets his medications. He almost forgot this appointment. He denies getting lost driving, but this did happen when he was in [place]. He has stopped multitasking because this has been difficult  for him, routine helps. He plays golf three times a week.   He has a history of migraines that resolved after stopping alcohol. He denies any dizziness, diplopia, dysarthria, dysphagia, neck/back pain, bowel/bladder dysfunction. He has occasional numbness in both hands. He cannot use the CPAP machine and has been taking sleep aids. He denies any head injuries. No family history of memory issues, however his parents and grandparents died at a younger age due to heart disease.  Laboratory Data:   Chemistry      Component Value Date/Time   NA 137 02/15/2014 1642   K 4.4 02/15/2014 1642   CL 105 02/15/2014 1642   CO2 26 02/15/2014 1642   BUN 22 02/15/2014 1642   CREATININE 1.0 02/15/2014 1642      Component Value Date/Time   CALCIUM 9.6 02/15/2014 1642     Lab Results  Component Value Date   CHOL 189 02/13/2014   HDL 32.20* 02/13/2014   LDLDIRECT 76.7 02/13/2014   TRIG * 02/13/2014    522.0 Triglyceride is over 400; calculations on Lipids are invalid.   CHOLHDL 6 02/13/2014   PAST MEDICAL HISTORY: Past Medical History  Diagnosis Date  . Atrial fibrillation     a. s/p tikosyn load and DCCV 02/2014  . HLD (hyperlipidemia)   . Unspecified essential hypertension   . CHF (congestive heart failure)   . Basal cell carcinoma   . History of gout   . OSA (obstructive sleep apnea)     a. not on CPAP  . Type II diabetes mellitus   . GERD (gastroesophageal reflux disease)   . CAD (coronary artery disease)  a. non obstructive by cath 05/2013    PAST SURGICAL HISTORY: Past Surgical History  Procedure Laterality Date  . Radiology with anesthesia N/A 05/11/2013    Procedure: RADIOLOGY WITH ANESTHESIA-CT;  Surgeon: Medication Radiologist, MD;  Location: [place];  Service: Radiology;  Laterality: N/A;  . Tee without cardioversion N/A 05/26/2013    Procedure: TRANSESOPHAGEAL ECHOCARDIOGRAM (TEE);  Surgeon: Larey Dresser, MD;  Location: [place];  Service: Cardiovascular;  Laterality:  N/A;  . Cardioversion N/A 05/26/2013    Procedure: CARDIOVERSION;  Surgeon: Larey Dresser, MD;  Location: [place];  Service: Cardiovascular;  Laterality: N/A;  . Tonsillectomy  ~ 1960  . Inguinal hernia repair  ~ 2005  . Excisional hemorrhoidectomy  ~ 2003  . Wrist fusion Right ~ 2003  . Shoulder open rotator cuff repair Right 2006  . Knee arthroscopy Right ~ 2009  . Cardiac catheterization  ~ 2010; 2015  . Refractive surgery Bilateral ~ 2005  . Nasal septum surgery  1976  . Mohs surgery  ~ 2010    "nose"  . Cardioversion N/A 02/08/2014    Procedure: CARDIOVERSION;  Surgeon: Larey Dresser, MD;  Location: [place];  Service: Cardiovascular;  Laterality: N/A;  . Left heart catheterization with coronary angiogram N/A 05/18/2013    Procedure: LEFT HEART CATHETERIZATION WITH CORONARY ANGIOGRAM;  Surgeon: Larey Dresser, MD;  Location: [facility] CATH LAB;  Service: Cardiovascular;  Laterality: N/A;    MEDICATIONS: Current Outpatient Prescriptions on File Prior to Visit  Medication Sig Dispense Refill  . allopurinol (ZYLOPRIM) 300 MG tablet Take 300 mg by mouth daily.    Marland Kitchen atorvastatin (LIPITOR) 40 MG tablet Take 40 mg by mouth daily.    Marland Kitchen BRINTELLIX 20 MG TABS Take 1 tablet by mouth at bedtime.   1  . dofetilide (TIKOSYN) 250 MCG capsule Take 1 capsule (250 mcg total) by mouth 2 (two) times daily. 60 capsule 11  . ELIQUIS 5 MG TABS tablet TAKE 1 TABLET BY MOUTH TWICE DAILY 60 tablet 3  . fenofibrate (TRICOR) 145 MG tablet Take 1 tablet (145 mg total) by mouth daily. 30 tablet 3  . furosemide (LASIX) 40 MG tablet TAKE 1 TABLET BY MOUTH EVERY DAY 30 tablet 11  . lisinopril (PRINIVIL,ZESTRIL) 40 MG tablet Take 1 tablet by mouth daily.  3  . Magnesium Oxide 400 (240 MG) MG TABS Take 400 mg by mouth 2 (two) times daily. 60 tablet 6  . metFORMIN (GLUCOPHAGE) 500 MG tablet Take 500 mg by mouth 2 (two) times daily with a meal.    . metoprolol succinate (TOPROL XL) 50 MG 24 hr tablet Take 1  tablet (50 mg total) by mouth 2 (two) times daily. 180 tablet 3  . Multiple Vitamins-Minerals (MULTIVITAMIN PO) Take 1 tablet by mouth daily.    Marland Kitchen omeprazole (PRILOSEC) 40 MG capsule Take 40 mg by mouth 2 (two) times daily.    . potassium chloride SA (K-DUR,KLOR-CON) 20 MEQ tablet TAKE 1 TABLET BY MOUTH EVERY DAY 30 tablet 11  . temazepam (RESTORIL) 30 MG capsule Take 30 mg by mouth at bedtime.    . traZODone (DESYREL) 150 MG tablet Take 150 mg by mouth at bedtime.     No current facility-administered medications on file prior to visit.    ALLERGIES: No Known Allergies  FAMILY HISTORY: Family History  Problem Relation Age of Onset  . CAD      family history  . Heart attack Mother   . Heart attack Father  SOCIAL HISTORY: History   Social History  . Marital Status: Married    Spouse Name: N/A    Number of Children: N/A  . Years of Education: N/A   Occupational History  . Not on file.   Social History Main Topics  . Smoking status: Former Smoker -- 2.00 packs/day for 18 years    Types: Cigarettes, Cigars  . Smokeless tobacco: Never Used     Comment: quit smoking cigarettes in 1983  . Alcohol Use: 3.6 oz/week    6 Cans of beer per week  . Drug Use: No  . Sexual Activity: Yes   Other Topics Concern  . Not on file   Social History Narrative    REVIEW OF SYSTEMS: Constitutional: No fevers, chills, or sweats, no generalized fatigue, change in appetite Eyes: No visual changes, double vision, eye pain Ear, nose and throat: No hearing loss, ear pain, nasal congestion, sore throat Cardiovascular: No chest pain, palpitations Respiratory:  No shortness of breath at rest or with exertion, wheezes GastrointestinaI: No nausea, vomiting, diarrhea, abdominal pain, fecal incontinence Genitourinary:  No dysuria, urinary retention or frequency Musculoskeletal:  No neck pain, back pain Integumentary: No rash, pruritus, skin lesions Neurological: as above Psychiatric: No  depression, insomnia, anxiety Endocrine: No palpitations, fatigue, diaphoresis, mood swings, change in appetite, change in weight, increased thirst Hematologic/Lymphatic:  No anemia, purpura, petechiae. Allergic/Immunologic: no itchy/runny eyes, nasal congestion, recent allergic reactions, rashes  PHYSICAL EXAM: Filed Vitals:   02/28/14 1422  BP: 110/70  Pulse: 89   General: No acute distress Head:  Normocephalic/atraumatic Eyes: Fundoscopic exam shows bilateral sharp discs, no vessel changes, exudates, or hemorrhages Neck: supple, no paraspinal tenderness, full range of motion Back: No paraspinal tenderness Heart: regular rate and rhythm Lungs: Clear to auscultation bilaterally. Vascular: No carotid bruits. Skin/Extremities: No rash, no edema Neurological Exam: Mental status: alert and oriented to person, place, and time, no dysarthria or aphasia, Fund of knowledge is appropriate.  Remote memory intact.  Attention and concentration are normal.    Able to name objects and repeat phrases.  Montreal Cognitive Assessment  02/28/2014  Visuospatial/ Executive (0/5) 5  Naming (0/3) 3  Attention: Read list of digits (0/2) 2  Attention: Read list of letters (0/1) 1  Attention: Serial 7 subtraction starting at 100 (0/3) 3  Language: Repeat phrase (0/2) 2  Language : Fluency (0/1) 1  Abstraction (0/2) 2  Delayed Recall (0/5) 1  Orientation (0/6) 6  Total 26  Adjusted Score (based on education) 26   Cranial nerves: CN I: not tested CN II: pupils equal, round and reactive to light, visual fields intact, fundi unremarkable. CN III, IV, VI:  full range of motion, no nystagmus, no ptosis CN V: facial sensation intact CN VII: upper and lower face symmetric CN VIII: hearing intact to finger rub CN IX, X: gag intact, uvula midline CN XI: sternocleidomastoid and trapezius muscles intact CN XII: tongue midline Bulk & Tone: normal, no fasciculations. Motor: 5/5 throughout with no pronator  drift. Sensation: intact to light touch, cold, pin, vibration and joint position sense.  No extinction to double simultaneous stimulation.  Romberg test negative Deep Tendon Reflexes: +2 throughout, no ankle clonus Plantar responses: downgoing bilaterally Cerebellar: no incoordination on finger to nose, heel to shin. No dysdiadochokinesia Gait: narrow-based and steady, able to tandem walk adequately. Tremor: none  IMPRESSION: This is a very pleasant 68 year old right-handed man with a hypertension, atrial fibrillation, diabetes, hyperlipidemia, presenting for word-finding difficulties and  short-term memory changes. His MOCA today is still within normal at 26/30, with missed points for delayed recall. He reports symptoms are similar to his symptoms that were evaluated fully at the [facility] 9 years ago, felt to be due to extreme stress. He is again under significant stress at this time. We discussed pseudodementia and effects of mood on memory. He will benefit from returning to psychotherapy, and possibly seeing psychiatry as well for medication management. We discussed the importance of physical and brain stimulation exercises, control of vascular risk factors, for brain health. He will follow-up in 6 months..  Thank you for allowing me to participate in the care of this patient. Please do not hesitate to call for any questions or concerns.   Ellouise Newer, M.D.  CC: Dr. Melford Aase

## 2014-02-28 NOTE — Patient Instructions (Addendum)
1. We will send referral to Behavioral Health-[phone] 2. Physical exercise and brain stimulation exercises (crossword puzzles, word search) are helpful for brain health 3. Follow-up in 6 months

## 2014-03-03 ENCOUNTER — Encounter: Payer: Self-pay | Admitting: Neurology

## 2014-03-05 NOTE — Progress Notes (Signed)
Note faxed.

## 2014-03-19 ENCOUNTER — Encounter: Payer: Medicare Other | Admitting: Nurse Practitioner

## 2014-04-04 ENCOUNTER — Ambulatory Visit (INDEPENDENT_AMBULATORY_CARE_PROVIDER_SITE_OTHER): Payer: BLUE CROSS/BLUE SHIELD | Admitting: Nurse Practitioner

## 2014-04-04 ENCOUNTER — Encounter: Payer: Self-pay | Admitting: Nurse Practitioner

## 2014-04-04 VITALS — BP 110/82 | HR 76 | Ht 74.0 in | Wt 255.8 lb

## 2014-04-04 DIAGNOSIS — I2510 Atherosclerotic heart disease of native coronary artery without angina pectoris: Secondary | ICD-10-CM

## 2014-04-04 DIAGNOSIS — I10 Essential (primary) hypertension: Secondary | ICD-10-CM

## 2014-04-04 DIAGNOSIS — I4891 Unspecified atrial fibrillation: Secondary | ICD-10-CM

## 2014-04-04 DIAGNOSIS — I5032 Chronic diastolic (congestive) heart failure: Secondary | ICD-10-CM

## 2014-04-04 LAB — CBC
HCT: 41.4 % (ref 39.0–52.0)
Hemoglobin: 14.1 g/dL (ref 13.0–17.0)
MCHC: 34.2 g/dL (ref 30.0–36.0)
MCV: 90.5 fl (ref 78.0–100.0)
Platelets: 262 10*3/uL (ref 150.0–400.0)
RBC: 4.57 Mil/uL (ref 4.22–5.81)
RDW: 13.7 % (ref 11.5–15.5)
WBC: 8.8 10*3/uL (ref 4.0–10.5)

## 2014-04-04 LAB — BASIC METABOLIC PANEL
BUN: 38 mg/dL — ABNORMAL HIGH (ref 6–23)
CO2: 28 mEq/L (ref 19–32)
Calcium: 9.9 mg/dL (ref 8.4–10.5)
Chloride: 104 mEq/L (ref 96–112)
Creatinine, Ser: 1.54 mg/dL — ABNORMAL HIGH (ref 0.40–1.50)
GFR: 48.14 mL/min — ABNORMAL LOW (ref >60.00)
Glucose, Bld: 132 mg/dL — ABNORMAL HIGH (ref 70–99)
Potassium: 4.2 mEq/L (ref 3.5–5.1)
Sodium: 139 mEq/L (ref 135–145)

## 2014-04-04 NOTE — Patient Instructions (Addendum)
We will be checking the following labs today BMET, CBC and MG  See Dr. Aundra Dubin in March as planned   Stay on your current medicines  Call the [place] office at [phone] if you have any questions, problems or concerns.

## 2014-04-04 NOTE — Progress Notes (Addendum)
CARDIOLOGY OFFICE NOTE  Date:  04/04/2014    Edgar Hogan Date of Birth: 01-Apr-1945 Medical Record #[id number]  PCP:  Chesley Noon, MD  Cardiologist:  Aundra Dubin & Allred.   Chief Complaint  Patient presents with  . Atrial Fibrillation    Post hospital visit - seen for Dr. Aundra Dubin & Allred.      History of Present Illness: Edgar Hogan is a 69 y.o. male who presents today for a follow up/post hospital visit. Seen for Dr. Aundra Dubin & Allred. He has a history of non obst CAD, PAF on Eliquis, OSA, DM2, HLD, gout, NICM (EF 40-45% with improvement to > 50-55%) and HTN.   Patient initially went into atrial fibrillation in 05/2013 after undergoing general anaesthesia for CTA (patient gets claustrophobic). He was placed on Toprol XL and Eliquis. ECHO in 3/15showed EF 40-45% with diffuse hypokinesis. He had volume overload and was started on Lasix. In 3/15, Dr. Aundra Dubin did a TEE-guided cardioversion with successful conversion to NSR.TEE confirmed EF about 40-45%. LHC showed EF 45% with nonobstructive coronary disease. After cardioversion, patient continued to feel palpitations.2 week monitor in 4/15 showed PVCs but no atrial fibrillation. Repeat echo in 9/15 showed EF 50-55%, mild LVH, mild MR. He was seen in the office on 01/14/14 and found to be in back in atrial fibrillation and reported increased fatigue.It was decided to admit him for Tikosyn loading and proceed with DCCV at [facility]. He has not missed any Eliquis doses.   He pesented to [facility] on 02/06/14 for initiation of Tikosyn and planned DCCV.This was successful.  Called shortly after his discharge - not feeling well. Seen by Dorris Carnes (DOD) - he was in NSR. He was reassured.   Comes in today. Here alone today. He is doing well. He says he is having no problems. No chest pain. Has chronic DOE. No regular exercise. No palpitations. Taking his medicines but does not know what they are - wife handles all of his medicines. He has no concerns  today.   Past Medical History  Diagnosis Date  . Atrial fibrillation     a. s/p tikosyn load and DCCV 02/2014  . HLD (hyperlipidemia)   . Unspecified essential hypertension   . CHF (congestive heart failure)   . Basal cell carcinoma   . History of gout   . OSA (obstructive sleep apnea)     a. not on CPAP  . Type II diabetes mellitus   . GERD (gastroesophageal reflux disease)   . CAD (coronary artery disease)     a. non obstructive by cath 05/2013   Past Medical History:  1. HTN  2. Type II diabetes  3. Gout  4. Hyperlipidemia  5. OSA: Not on CPAP (does not tolerate).  6. Atrial fibrillation: Paroxysmal. 1st diagnosed 3/15. Echo (3/15) with EF 40-45%, diffuse hypokinesis, moderate LVH, moderate MR, moderate LAE, moderately dilated RV with normal RV systolic function. DCCV to NSR in 3/15. 2 week monitor in 4/15 with PVC, no atrial fibrillation. Back in atrial fibrillation at 11/15 office visit.  7. CAD: LHC 2007 with luminal irregularities. CTA chest (3/15) with coronary artery calcifications. LHC (3/15) with EF 45%, nonobstructive CAD.  8. Cardiomyopathy: Nonischemic, ?tachycardia-mediated. Echo (3/15) with EF 40-45%, diffuse hypokinesis, moderate LVH, moderate MR, moderate LAE, moderately dilated RV with normal RV systolic function. TEE (3/15) with EF 40-45%, diffuse hypokinesis, normal RV. Echo (9/15) with EF 50-55%, mild LVH, mild MR.   Past Surgical History  Procedure Laterality Date  .  Radiology with anesthesia N/A 05/11/2013    Procedure: RADIOLOGY WITH ANESTHESIA-CT;  Surgeon: Medication Radiologist, MD;  Location: [place];  Service: Radiology;  Laterality: N/A;  . Tee without cardioversion N/A 05/26/2013    Procedure: TRANSESOPHAGEAL ECHOCARDIOGRAM (TEE);  Surgeon: Larey Dresser, MD;  Location: [place];  Service: Cardiovascular;  Laterality: N/A;  . Cardioversion N/A 05/26/2013    Procedure: CARDIOVERSION;  Surgeon: Larey Dresser, MD;  Location: [place];   Service: Cardiovascular;  Laterality: N/A;  . Tonsillectomy  ~ 1960  . Inguinal hernia repair  ~ 2005  . Excisional hemorrhoidectomy  ~ 2003  . Wrist fusion Right ~ 2003  . Shoulder open rotator cuff repair Right 2006  . Knee arthroscopy Right ~ 2009  . Cardiac catheterization  ~ 2010; 2015  . Refractive surgery Bilateral ~ 2005  . Nasal septum surgery  1976  . Mohs surgery  ~ 2010    "nose"  . Cardioversion N/A 02/08/2014    Procedure: CARDIOVERSION;  Surgeon: Larey Dresser, MD;  Location: [place];  Service: Cardiovascular;  Laterality: N/A;  . Left heart catheterization with coronary angiogram N/A 05/18/2013    Procedure: LEFT HEART CATHETERIZATION WITH CORONARY ANGIOGRAM;  Surgeon: Larey Dresser, MD;  Location: [facility] C[facility];  Service: Cardiovascular;  Laterality: N/A;     Medications: Current Outpatient Prescriptions  Medication Sig Dispense Refill  . allopurinol (ZYLOPRIM) 300 MG tablet Take 300 mg by mouth daily.    Marland Kitchen atorvastatin (LIPITOR) 40 MG tablet Take 40 mg by mouth daily.    Marland Kitchen BRINTELLIX 20 MG TABS Take 1 tablet by mouth at bedtime.   1  . dofetilide (TIKOSYN) 250 MCG capsule Take 1 capsule (250 mcg total) by mouth 2 (two) times daily. 60 capsule 11  . ELIQUIS 5 MG TABS tablet TAKE 1 TABLET BY MOUTH TWICE DAILY 60 tablet 3  . fenofibrate (TRICOR) 145 MG tablet Take 1 tablet (145 mg total) by mouth daily. 30 tablet 3  . furosemide (LASIX) 40 MG tablet TAKE 1 TABLET BY MOUTH EVERY DAY 30 tablet 11  . lisinopril (PRINIVIL,ZESTRIL) 40 MG tablet Take 1 tablet by mouth daily.  3  . Magnesium Oxide 400 (240 MG) MG TABS Take 400 mg by mouth 2 (two) times daily. 60 tablet 6  . metFORMIN (GLUCOPHAGE) 500 MG tablet Take 500 mg by mouth 2 (two) times daily with a meal.    . metoprolol succinate (TOPROL XL) 50 MG 24 hr tablet Take 1 tablet (50 mg total) by mouth 2 (two) times daily. 180 tablet 3  . Multiple Vitamins-Minerals (MULTIVITAMIN PO) Take 1 tablet by mouth daily.      Marland Kitchen omeprazole (PRILOSEC) 40 MG capsule Take 40 mg by mouth 2 (two) times daily.    . potassium chloride SA (K-DUR,KLOR-CON) 20 MEQ tablet TAKE 1 TABLET BY MOUTH EVERY DAY 30 tablet 11  . temazepam (RESTORIL) 30 MG capsule Take 30 mg by mouth at bedtime.    . traZODone (DESYREL) 150 MG tablet Take 150 mg by mouth at bedtime.     No current facility-administered medications for this visit.    Allergies: No Known Allergies  Social History: The patient  reports that he has quit smoking. His smoking use included Cigarettes and Cigars. He has a 36 pack-year smoking history. He has never used smokeless tobacco. He reports that he drinks about 3.6 oz of alcohol per week. He reports that he does not use illicit drugs.   Family History: The  patient's family history includes CAD in an other family member; Heart attack in his father and mother.   Review of Systems: Please see the history of present illness.   Otherwise, the review of systems is positive for .   All other systems are reviewed and negative.   Physical Exam: VS:  BP 110/82 mmHg  Pulse 76  Ht 6\' 2"  (1.88 m)  Wt 255 lb 12.8 oz (116.03 kg)  BMI 32.83 kg/m2 .  BMI Body mass index is 32.83 kg/(m^2). He is obese.  General: Pleasant. Well developed, well nourished and in no acute distress.  HEENT: Normal. Neck: Supple, no JVD, carotid bruits, or masses noted.  Cardiac: Regular rate and rhythm. No murmurs, rubs, or gallops. No edema.  Respiratory:  Lungs are clear to auscultation bilaterally with normal work of breathing.  GI: Soft and nontender.  MS: No deformity or atrophy. Gait and ROM intact. Skin: Warm and dry. Color is normal.  Neuro:  Strength and sensation are intact and no gross focal deficits noted.  Psych: Alert, appropriate and with normal affect.   Wt Readings from Last 3 Encounters:  04/04/14 255 lb 12.8 oz (116.03 kg)  02/28/14 252 lb (114.306 kg)  02/15/14 251 lb 12.8 oz (114.216 kg)    LABORATORY DATA:  EKG:   EKG is ordered today. The EKG ordered today demonstrates NSR with PVC. QTc is 427 .  Lab Results  Component Value Date   WBC 13.5* 01/12/2014   HGB 13.9 01/12/2014   HCT 42.6 01/12/2014   PLT 229.0 01/12/2014   GLUCOSE 94 02/15/2014   CHOL 189 02/13/2014   TRIG * 02/13/2014    522.0 Triglyceride is over 400; calculations on Lipids are invalid.   HDL 32.20* 02/13/2014   LDLDIRECT 76.7 02/13/2014   NA 137 02/15/2014   K 4.4 02/15/2014   CL 105 02/15/2014   CREATININE 1.0 02/15/2014   BUN 22 02/15/2014   CO2 26 02/15/2014   TSH 1.582 05/11/2013   INR 1.3* 05/16/2013    BNP (last 3 results)  Recent Labs  05/11/13 1549  PROBNP 548.9*    Other Studies Reviewed Today:  Echo Study Conclusions from 11/2013  - Left ventricle: The cavity size was normal. Wall thickness was increased in a pattern of mild LVH. Systolic function was normal. The estimated ejection fraction was in the range of 50% to 55%. Wall motion was normal; there were no regional wall motion abnormalities. - Mitral valve: There was mild regurgitation. - Left atrium: The atrium was moderately dilated. - Right atrium: The atrium was mildly dilated.  Electrical Cardioversion Procedure Note Edgar Hogan [id number] [birth year]  Procedure: Electrical Cardioversion Indications: Atrial Fibrillation. He has been on Xarelto and Tikosyn.   Procedure Details Consent: Risks of procedure as well as the alternatives and risks of each were explained to the (patient/caregiver). Consent for procedure obtained. Time Out: Verified patient identification, verified procedure, site/side was marked, verified correct patient position, special equipment/implants available, medications/allergies/relevent history reviewed, required imaging and test results available. Performed  Patient placed on cardiac monitor, pulse oximetry, supplemental oxygen as necessary.  Sedation given: Propofol per anesthesiology Pacer pads  placed anterior and posterior chest.  Cardioverted 1 time(s).  Cardioverted at [place].  Evaluation Findings: Post procedure EKG shows: NSR Complications: None Patient did tolerate procedure well. [name] 02/08/2014, 9:28 AM     Assessment/Plan: 1. PAF - s/p loading with Tikosyn and repeat cardioversion - remains in NSR. - recheck BMET and Mg today. Has  follow up in March with Dr. Aundra Dubin  2. Nonobstructive CAD - no active symptoms. Does not appear motivated to work on CV risk factors.   3. NICM - with improvement in LV function -  No symptoms  4. Chronic anticoagulation -  No problems noted.   Current medicines are reviewed with the patient today.  The patient does not have concerns regarding medicines.  The following changes have been made:  no change  Labs/ tests ordered today include:    Orders Placed This Encounter  Procedures  . Basic metabolic panel  . CBC  . Magnesium  . EKG 12-Lead     Disposition:   FU with Dr. Aundra Dubin in March as planned.    Patient is agreeable to this plan and will call if any problems develop in the interim.   Signed: Burtis Junes, RN, ANP-C 04/04/2014 2:57 PM  [facility] [address] [place], [place]  [address] Phone: [phone] Fax: [phone]        Addendum:  Patient brought in a copy of his advanced directives - given to Medical records for scanning purposes.

## 2014-04-05 ENCOUNTER — Other Ambulatory Visit: Payer: Self-pay | Admitting: *Deleted

## 2014-04-05 DIAGNOSIS — I5032 Chronic diastolic (congestive) heart failure: Secondary | ICD-10-CM

## 2014-04-05 DIAGNOSIS — I4891 Unspecified atrial fibrillation: Secondary | ICD-10-CM

## 2014-04-05 DIAGNOSIS — I2510 Atherosclerotic heart disease of native coronary artery without angina pectoris: Secondary | ICD-10-CM

## 2014-04-05 LAB — MAGNESIUM: Magnesium: 1.8 mg/dL (ref 1.5–2.5)

## 2014-04-13 ENCOUNTER — Other Ambulatory Visit (INDEPENDENT_AMBULATORY_CARE_PROVIDER_SITE_OTHER): Payer: BLUE CROSS/BLUE SHIELD | Admitting: *Deleted

## 2014-04-13 DIAGNOSIS — I4891 Unspecified atrial fibrillation: Secondary | ICD-10-CM

## 2014-04-13 DIAGNOSIS — I5032 Chronic diastolic (congestive) heart failure: Secondary | ICD-10-CM

## 2014-04-13 DIAGNOSIS — I2510 Atherosclerotic heart disease of native coronary artery without angina pectoris: Secondary | ICD-10-CM

## 2014-04-13 LAB — BASIC METABOLIC PANEL
BUN: 24 mg/dL — ABNORMAL HIGH (ref 6–23)
CO2: 27 mEq/L (ref 19–32)
CREATININE: 1.26 mg/dL (ref 0.40–1.50)
Calcium: 9.5 mg/dL (ref 8.4–10.5)
Chloride: 105 mEq/L (ref 96–112)
GFR: 60.68 mL/min (ref >60.00)
GLUCOSE: 111 mg/dL — AB (ref 70–99)
Potassium: 4.3 mEq/L (ref 3.5–5.1)
Sodium: 139 mEq/L (ref 135–145)

## 2014-04-18 ENCOUNTER — Other Ambulatory Visit: Payer: Self-pay

## 2014-04-18 MED ORDER — FENOFIBRATE 145 MG PO TABS: 145.0000 mg | ORAL_TABLET | Freq: Every day | ORAL | Status: AC

## 2014-04-26 ENCOUNTER — Other Ambulatory Visit (INDEPENDENT_AMBULATORY_CARE_PROVIDER_SITE_OTHER): Payer: BLUE CROSS/BLUE SHIELD | Admitting: *Deleted

## 2014-04-26 DIAGNOSIS — E785 Hyperlipidemia, unspecified: Secondary | ICD-10-CM

## 2014-04-26 LAB — LIPID PANEL
CHOLESTEROL: 227 mg/dL — AB (ref 0–200)
HDL: 38.4 mg/dL — AB (ref >39.00)
NonHDL: 188.6
Total CHOL/HDL Ratio: 6
Triglycerides: 262 mg/dL — ABNORMAL HIGH (ref 0.0–149.0)
VLDL: 52.4 mg/dL — ABNORMAL HIGH (ref 0.0–40.0)

## 2014-04-26 LAB — LDL CHOLESTEROL, DIRECT: Direct LDL: 144 mg/dL

## 2014-05-03 ENCOUNTER — Telehealth: Payer: Self-pay | Admitting: Cardiology

## 2014-05-03 ENCOUNTER — Encounter: Payer: Self-pay | Admitting: *Deleted

## 2014-05-03 DIAGNOSIS — E785 Hyperlipidemia, unspecified: Secondary | ICD-10-CM

## 2014-05-03 DIAGNOSIS — I480 Paroxysmal atrial fibrillation: Secondary | ICD-10-CM

## 2014-05-03 MED ORDER — ATORVASTATIN CALCIUM 80 MG PO TABS: 80.0000 mg | ORAL_TABLET | Freq: Every day | ORAL | Status: DC

## 2014-05-03 NOTE — Telephone Encounter (Signed)
New message ° ° ° ° °Returning Anne's call °

## 2014-05-03 NOTE — Telephone Encounter (Signed)
Spoke with patient about recent cholesterol results.

## 2014-05-13 ENCOUNTER — Other Ambulatory Visit: Payer: Self-pay | Admitting: Cardiology

## 2014-05-24 ENCOUNTER — Encounter: Payer: Self-pay | Admitting: Cardiology

## 2014-05-24 ENCOUNTER — Ambulatory Visit (INDEPENDENT_AMBULATORY_CARE_PROVIDER_SITE_OTHER): Payer: BLUE CROSS/BLUE SHIELD | Admitting: Cardiology

## 2014-05-24 ENCOUNTER — Other Ambulatory Visit: Payer: Self-pay | Admitting: *Deleted

## 2014-05-24 VITALS — BP 122/68 | HR 58 | Ht 74.0 in | Wt 257.0 lb

## 2014-05-24 DIAGNOSIS — I5032 Chronic diastolic (congestive) heart failure: Secondary | ICD-10-CM

## 2014-05-24 DIAGNOSIS — E785 Hyperlipidemia, unspecified: Secondary | ICD-10-CM | POA: Diagnosis not present

## 2014-05-24 DIAGNOSIS — I480 Paroxysmal atrial fibrillation: Secondary | ICD-10-CM

## 2014-05-24 DIAGNOSIS — I2510 Atherosclerotic heart disease of native coronary artery without angina pectoris: Secondary | ICD-10-CM

## 2014-05-24 MED ORDER — FUROSEMIDE 20 MG PO TABS: 20.0000 mg | ORAL_TABLET | Freq: Every day | ORAL | Status: DC

## 2014-05-24 NOTE — Patient Instructions (Signed)
Your physician recommends that you return for a FASTING lipid profile /liver profile/Magnesium level/CBCd/BMET--the end of April 2016.  Your physician wants you to follow-up in: 6 months with Dr Aundra Dubin. (September 2016).  You will receive a reminder letter in the mail two months in advance. If you don't receive a letter, please call our office to schedule the follow-up appointment.

## 2014-05-24 NOTE — Progress Notes (Signed)
Patient ID: Edgar Hogan, male   DOB: [birth year], 69 y.o.   MRN: [id number] PCP: Dr. Melford Aase  69 yo with history of HTN, type II diabetes, OSA, and paroxysmal atrial fibrillation presents for cardiology followup today. Patient had an CXR done with concern for thoracic aortic aneurysm. He is claustrophobic so came to [facility] on 05/11/13 for general anesthesia to get CTA chest done. Initially, anesthesia flow sheets say he was in NSR. However, at the end of the procedure, he was noted to be in atrial fibrillation. ECG confirmed atrial fibrillation with controlled rate. The CTA showed no aortic aneurysm but there was evidence for mild pulmonary edema and there was coronary calcification.  I saw him in the hospital and started him on a low dose of Toprol XL as well as Eliquis 5 mg bid. I had him do an echocardiogram in 3/15 which showed EF 40-45% with diffuse hypokinesis.  He had volume overload so I started him on Lasix.  In 3/15, I did a TEE-guided cardioversion with successful conversion to NSR.  TEE confirmed EF about 40-45%.  LHC showed EF 45% with nonobstructive coronary disease. After cardioversion, patient continued to feel palpitations.  2 week monitor in 4/15 showed PVCs but no atrial fibrillation.  Repeat echo in 9/15 showed EF 50-55%, mild LVH, mild MR.  He went back into atrial fibrillation and was admitted in 12/15 for Tikosyn.  During that hospitalization, I cardioverted him back to NSR.  He remains in NSR today.   Mr Bohr is doing well.  No recent palpitations or lightheadedness.  No chest pain.  Dyspnea with inclines or walking fast up steps.  Atorvastatin was recently increased with elevated creatinine.   ECG: atrial fibrillation, QTc 428 msec, nonspecific T wave flattening  Labs (3/15): K 4.3, creatinine 0.8 =>0.9, BNP 549, TSH normal, HCT 40.5 Labs (2/16): K 4.3, creatinine 1.26, LDL 144, HDL 38, TGs 262  Review of systems complete and found to be negative unless listed above in HPI     Past Medical History:  1. HTN  2. Type II diabetes  3. Gout  4. Hyperlipidemia  5. OSA: Not on CPAP (does not tolerate).   6. Atrial fibrillation: Paroxysmal.  1st diagnosed 3/15. Echo (3/15) with EF 40-45%, diffuse hypokinesis, moderate LVH, moderate MR, moderate LAE, moderately dilated RV with normal RV systolic function. DCCV to NSR in 3/15.  2 week monitor in 4/15 with PVC, no atrial fibrillation.  Back in atrial fibrillation at 11/15 office visit. Admitted in 12/15 for Tikosyn load and DCCV.   7. CAD: LHC 2007 with luminal irregularities. CTA chest (3/15) with coronary artery calcifications. LHC (3/15) with EF 45%, nonobstructive CAD.  8. Cardiomyopathy: Nonischemic, ?tachycardia-mediated.  Echo (3/15) with EF 40-45%, diffuse hypokinesis, moderate LVH, moderate MR, moderate LAE, moderately dilated RV with normal RV systolic function.  TEE (3/15) with EF 40-45%, diffuse hypokinesis, normal RV. Echo (9/15) with EF 50-55%, mild LVH, mild MR.   SH: Prior smoker, quit 1983. Married, wife is dermatopathologist. Has children.  Retired Forensic psychologist.    FH: CAD    ROS: All systems reviewed and negative except as per HPI.   Current Outpatient Prescriptions  Medication Sig Dispense Refill  . allopurinol (ZYLOPRIM) 300 MG tablet Take 300 mg by mouth daily.    Marland Kitchen atorvastatin (LIPITOR) 80 MG tablet Take 1 tablet (80 mg total) by mouth daily. 30 tablet 3  . BRINTELLIX 10 MG TABS Take 1 tablet by mouth daily.    Marland Kitchen  diazepam (VALIUM) 10 MG tablet Take 5-10mg  as needed prior to flying  0  . dofetilide (TIKOSYN) 250 MCG capsule Take 1 capsule (250 mcg total) by mouth 2 (two) times daily. 60 capsule 11  . ELIQUIS 5 MG TABS tablet TAKE 1 TABLET BY MOUTH TWICE A DAY 60 tablet 1  . fenofibrate (TRICOR) 145 MG tablet Take 1 tablet (145 mg total) by mouth daily. 30 tablet 3  . lamoTRIgine (LAMICTAL) 25 MG tablet Take 2 tablets by mouth daily.  1  . lisinopril (PRINIVIL,ZESTRIL) 40 MG tablet Take 1 tablet by  mouth daily.  3  . magnesium oxide (MAG-OX) 400 (241.3 MG) MG tablet Take 1 tablet by mouth 2 (two) times daily.  6  . metFORMIN (GLUCOPHAGE) 500 MG tablet Take 500 mg by mouth 2 (two) times daily with a meal.    . metoprolol succinate (TOPROL XL) 50 MG 24 hr tablet Take 1 tablet (50 mg total) by mouth 2 (two) times daily. 180 tablet 3  . Multiple Vitamins-Minerals (MULTIVITAMIN PO) Take 1 tablet by mouth daily.    Marland Kitchen omeprazole (PRILOSEC) 40 MG capsule Take 40 mg by mouth 2 (two) times daily.    . potassium chloride SA (K-DUR,KLOR-CON) 20 MEQ tablet TAKE 1 TABLET BY MOUTH EVERY DAY 30 tablet 11  . temazepam (RESTORIL) 30 MG capsule Take 30 mg by mouth at bedtime.    . traZODone (DESYREL) 150 MG tablet Take 150 mg by mouth at bedtime.    . furosemide (LASIX) 20 MG tablet Take 1 tablet (20 mg total) by mouth daily. 90 tablet 1   No current facility-administered medications for this visit.    BP 122/68 mmHg  Pulse 58  Ht 6\' 2"  (1.88 m)  Wt 257 lb (116.574 kg)  BMI 32.98 kg/m2 General: NAD Neck: Thick, JVP 7 cm, no thyromegaly or thyroid nodule.  Lungs: Clear to auscultation bilaterally with normal respiratory effort. CV: Nondisplaced PMI.  Heart irregular S1/S2, no S3/S4, no murmur.  No peripheral edema.  No carotid bruit.  Normal pedal pulses.  Abdomen: Soft, nontender, no hepatosplenomegaly, mild distention.  Skin: Intact without lesions or rashes.  Neurologic: Alert and oriented x 3.  Psych: Normal affect. Extremities: No clubbing or cyanosis.   Assessment/Plan:  1. Abnormal CXR: Patient does not have a thoracic aortic aneurysm by CTA.  2. Atrial fibrillation: NSR today on dofetilide.  ECG showed normal QTc interval. CHADSVASC = 5.   - Continue Eliquis and Toprol XL. - Continue dofetilide to maintain NSR.   - CBC in 4/16 given Eliquis use.   3. Chronic systolic CHF: Nonischemic cardiomyopathy.  Volume status better, NYHA class II symptoms.  EF up to 50-55% on last echo. No  significant coronary disease on LHC. Cardiomyopathy of uncertain etiology: atrial fibrillation was not rapid even when he was not on Toprol XL, so this seems less likely due to be tachy-mediated cardiomyopathy.    - Continue Toprol XL, lisinopril, and Lasix at current doses.  4. CAD: Nonobstructive CAD on cath.  Continue statin (recently increased).  Will repeat lipids/LFTs in 4/16.   5. OSA: Does not tolerate CPAP.   [name] 05/24/2014

## 2014-06-29 ENCOUNTER — Other Ambulatory Visit (INDEPENDENT_AMBULATORY_CARE_PROVIDER_SITE_OTHER): Payer: BLUE CROSS/BLUE SHIELD | Admitting: *Deleted

## 2014-06-29 DIAGNOSIS — I480 Paroxysmal atrial fibrillation: Secondary | ICD-10-CM | POA: Diagnosis not present

## 2014-06-29 DIAGNOSIS — E785 Hyperlipidemia, unspecified: Secondary | ICD-10-CM | POA: Diagnosis not present

## 2014-06-29 LAB — LIPID PANEL
CHOLESTEROL: 227 mg/dL — AB (ref 0–200)
HDL: 35.4 mg/dL — ABNORMAL LOW (ref >39.00)
NonHDL: 191.6
TRIGLYCERIDES: 230 mg/dL — AB (ref 0.0–149.0)
Total CHOL/HDL Ratio: 6
VLDL: 46 mg/dL — ABNORMAL HIGH (ref 0.0–40.0)

## 2014-06-29 LAB — HEPATIC FUNCTION PANEL
ALBUMIN: 4.5 g/dL (ref 3.5–5.2)
ALT: 34 U/L (ref 0–53)
AST: 31 U/L (ref 0–37)
Alkaline Phosphatase: 65 U/L (ref 39–117)
Bilirubin, Direct: 0.1 mg/dL (ref 0.0–0.3)
Total Bilirubin: 0.6 mg/dL (ref 0.2–1.2)
Total Protein: 7.1 g/dL (ref 6.0–8.3)

## 2014-06-29 LAB — LDL CHOLESTEROL, DIRECT: Direct LDL: 160 mg/dL

## 2014-06-29 NOTE — Addendum Note (Signed)
Addended by: Eulis Foster on: 06/29/2014 11:13 AM   Modules accepted: Orders

## 2014-07-01 ENCOUNTER — Other Ambulatory Visit: Payer: Self-pay | Admitting: Cardiology

## 2014-07-02 ENCOUNTER — Other Ambulatory Visit: Payer: Self-pay | Admitting: *Deleted

## 2014-07-02 DIAGNOSIS — I480 Paroxysmal atrial fibrillation: Secondary | ICD-10-CM

## 2014-07-02 DIAGNOSIS — E785 Hyperlipidemia, unspecified: Secondary | ICD-10-CM

## 2014-07-02 MED ORDER — ROSUVASTATIN CALCIUM 40 MG PO TABS
40.0000 mg | ORAL_TABLET | Freq: Every day | ORAL | Status: DC
Start: 2014-07-02 — End: 2014-11-25

## 2014-07-05 ENCOUNTER — Telehealth: Payer: Self-pay | Admitting: *Deleted

## 2014-07-05 NOTE — Telephone Encounter (Signed)
S/w Amy @ [phone] pt is approved for 60 months on Crestor ( 40 mg ) daily Approval # [id number]

## 2014-07-09 ENCOUNTER — Telehealth: Payer: Self-pay

## 2014-07-09 NOTE — Telephone Encounter (Signed)
Opened in error. crestor already apprroved

## 2014-08-13 ENCOUNTER — Other Ambulatory Visit: Payer: BLUE CROSS/BLUE SHIELD

## 2014-09-04 ENCOUNTER — Other Ambulatory Visit: Payer: Self-pay | Admitting: Cardiology

## 2014-09-06 NOTE — Telephone Encounter (Signed)
Per note 3.17.16

## 2014-09-07 ENCOUNTER — Other Ambulatory Visit: Payer: Self-pay

## 2014-09-07 MED ORDER — MAGNESIUM OXIDE 400 (241.3 MG) MG PO TABS: 1.0000 | ORAL_TABLET | Freq: Two times a day (BID) | ORAL | Status: DC

## 2014-09-29 ENCOUNTER — Other Ambulatory Visit: Payer: Self-pay | Admitting: Cardiology

## 2014-11-24 ENCOUNTER — Other Ambulatory Visit: Payer: Self-pay | Admitting: Cardiology

## 2014-11-25 ENCOUNTER — Other Ambulatory Visit: Payer: Self-pay | Admitting: Cardiology

## 2014-12-19 ENCOUNTER — Other Ambulatory Visit: Payer: Self-pay | Admitting: Cardiology

## 2014-12-19 MED ORDER — FENOFIBRATE 48 MG PO TABS: 48.0000 mg | ORAL_TABLET | Freq: Every day | ORAL | Status: DC

## 2014-12-20 ENCOUNTER — Other Ambulatory Visit: Payer: Self-pay | Admitting: Cardiology

## 2014-12-20 MED ORDER — FENOFIBRATE 48 MG PO TABS: 48.0000 mg | ORAL_TABLET | Freq: Every day | ORAL | Status: AC

## 2015-01-15 ENCOUNTER — Other Ambulatory Visit: Payer: BC Managed Care – PPO

## 2015-09-10 IMAGING — CT CT ANGIO CHEST
2 of 8 series · 17 of 46 positions shown · IV contrast (APPLIED)
Comparison: No prior films available for comparison.

CLINICAL DATA: Abnormal chest x-ray, possible aortic pseudoaneurysm

EXAM:
CT ANGIOGRAPHY CHEST WITH CONTRAST
TECHNIQUE: Multidetector CT imaging of the chest was performed using the
standard protocol during bolus administration of intravenous
contrast. Multiplanar CT image reconstructions and MIPs were
obtained to evaluate the vascular anatomy.
CONTRAST:  100mL OMNIPAQUE IOHEXOL 350 MG/ML SOLN

[Series 5: dissection 2.0 i30f 3 · axial · 0.81mm/px · z∈[-134,+138]mm · 14 of 152 slices shown]
[im 8/152  lung]
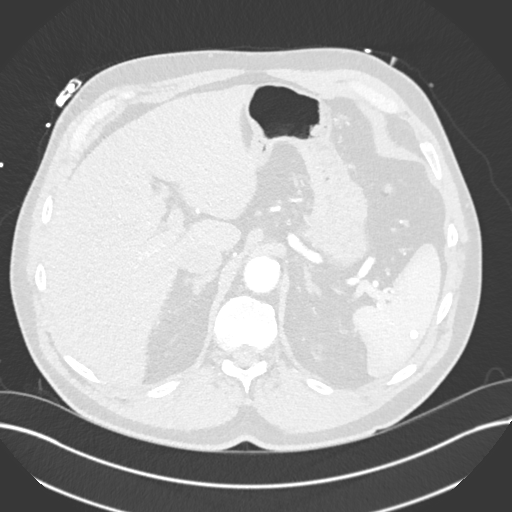
[im 22/152  soft-tissue]
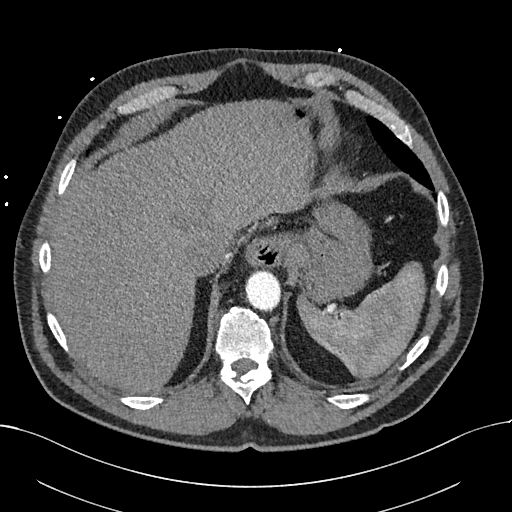
[im 29/152  lung]
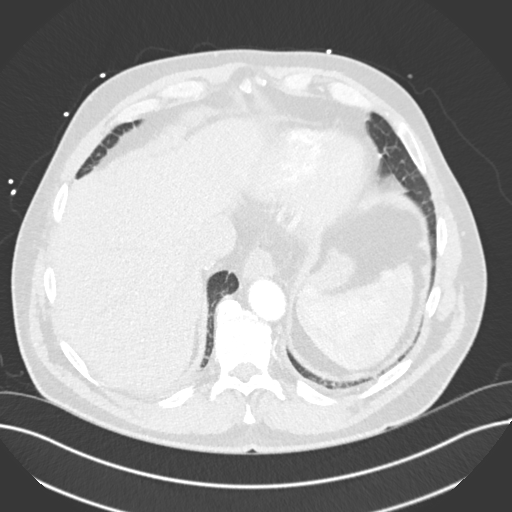
[im 44/152  soft-tissue]
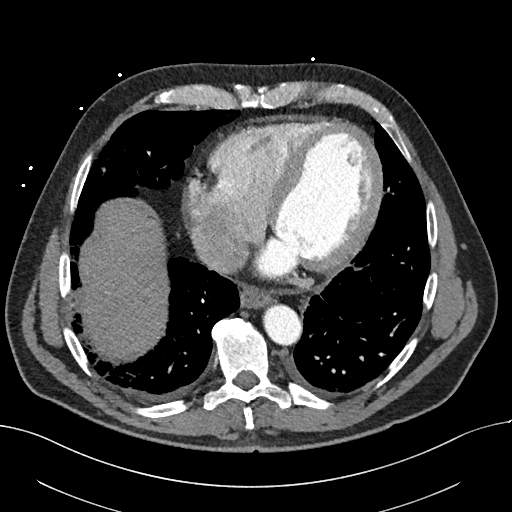
[im 51/152  lung]
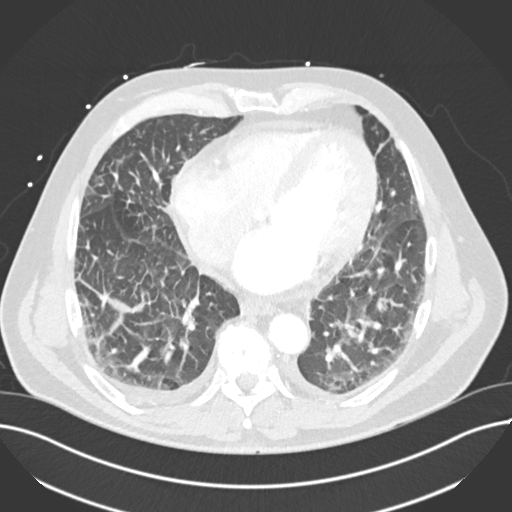
[im 58/152  soft-tissue]
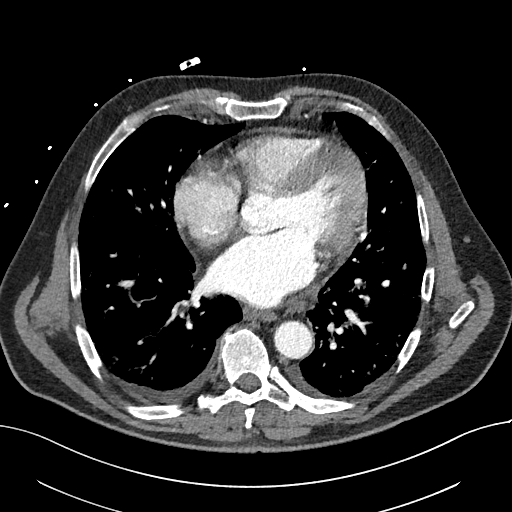
[im 72/152  lung]
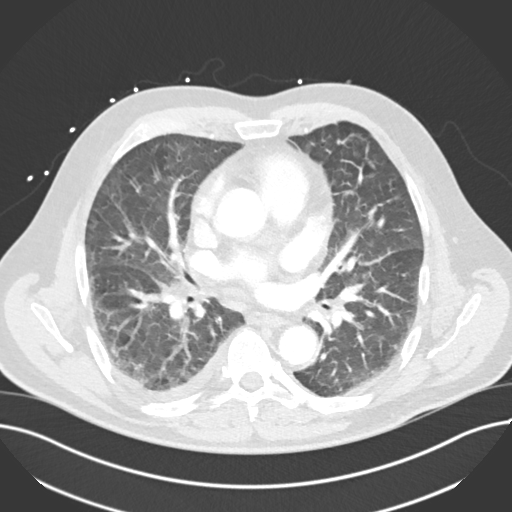
[im 80/152  soft-tissue]
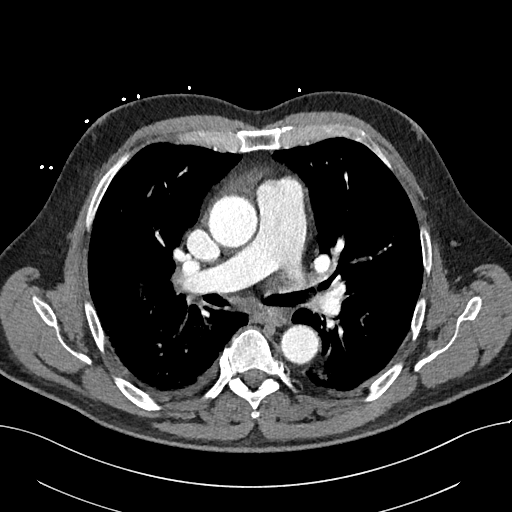
[im 94/152  lung]
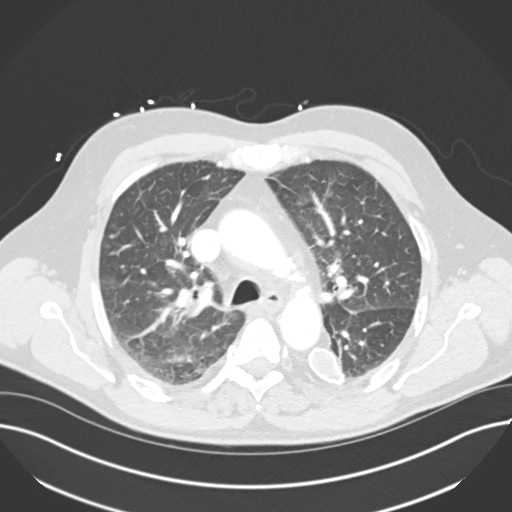
[im 101/152  soft-tissue]
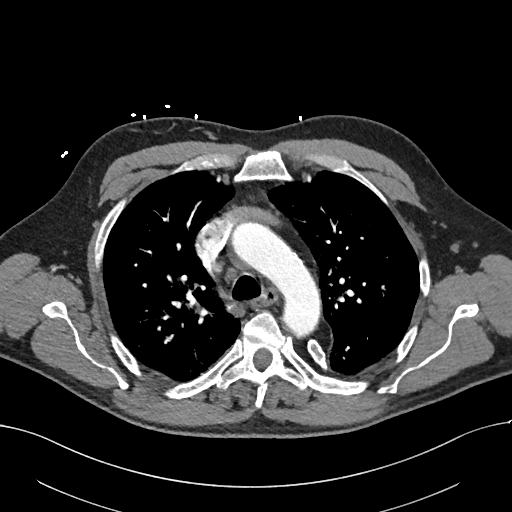
[im 116/152  lung]
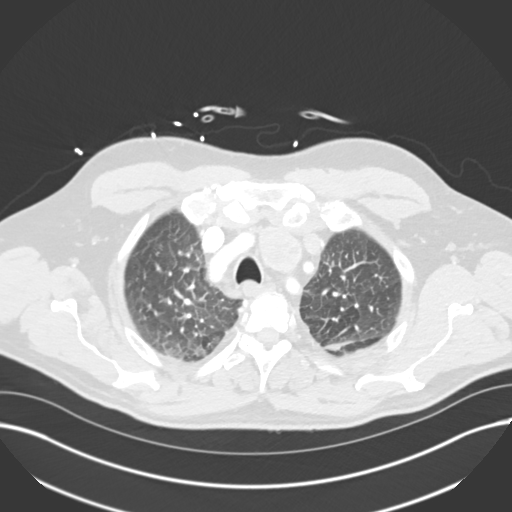
[im 123/152  soft-tissue]
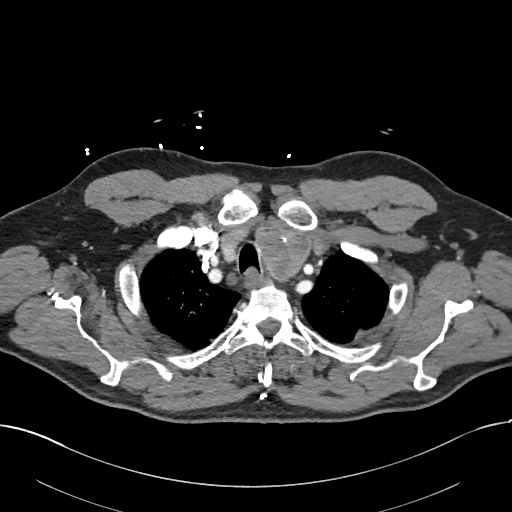
[im 130/152  lung]
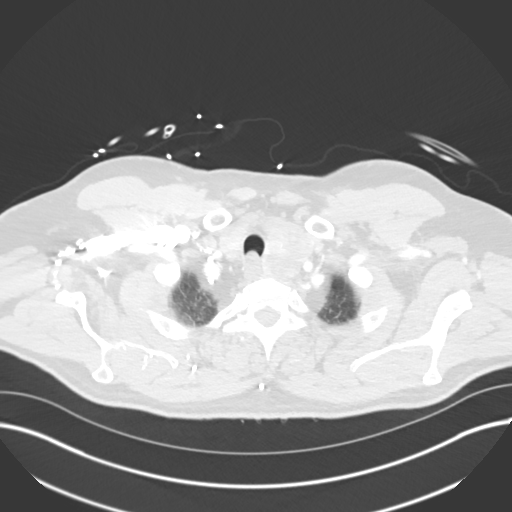
[im 144/152  soft-tissue]
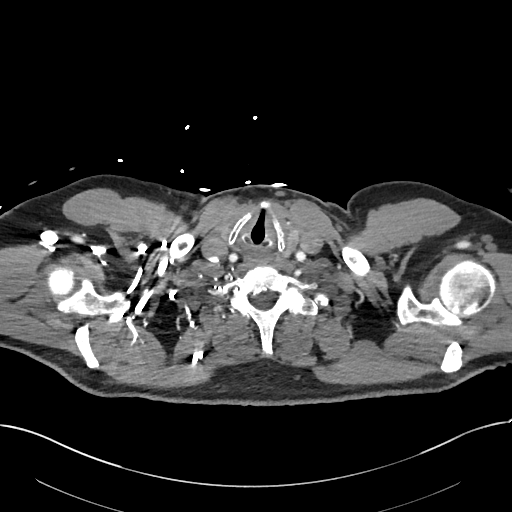

[Series 7: coronal mpr · coronal · 0.85mm/px · 3 of 178 slices shown]
[im 45/178  soft-tissue]
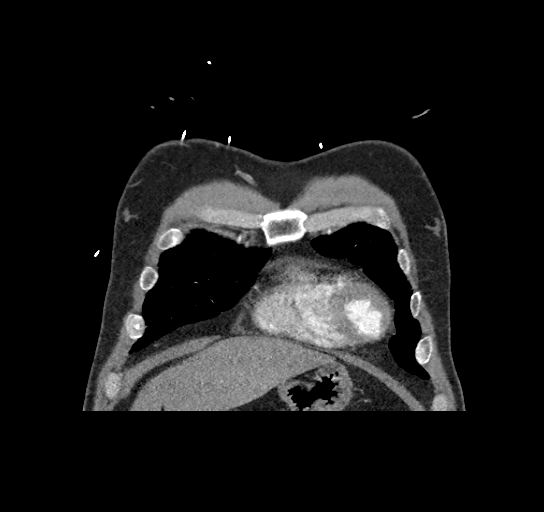
[im 89/178  soft-tissue]
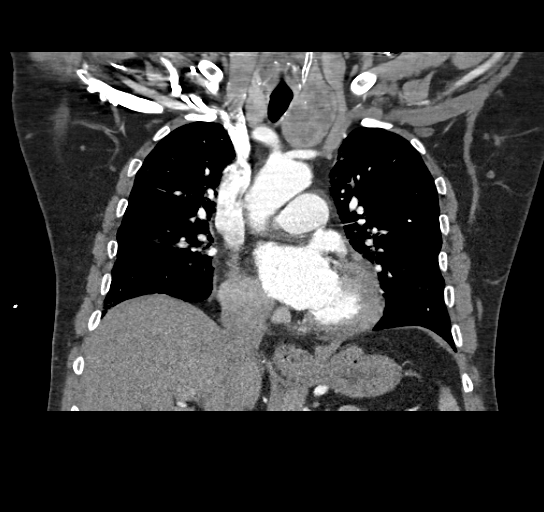
[im 133/178  soft-tissue]
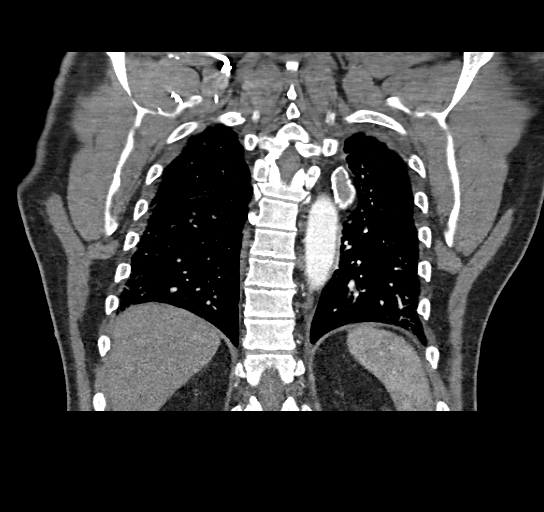

[17 of 46 positions shown; findings below may reference images not displayed]

FINDINGS: There is multinodular enlargement of pole left lobe of thyroid gland
a anterior nodule measures at least 3 cm. A small central
calcifications is noted within left lobe. Further evaluation with
thyroid gland ultrasound is recommended. Mild right deviation of
trachea.

Atherosclerotic calcifications are noted aortic knob. No aortic
aneurysm. Ascending aorta measures 4.2 cm in diameter. Descending
aorta measures 3.4 cm in diameter. Just posterior to descending
aorta there is elongated collection with peripheral calcification
best seen in sagittal image 105 measures 4.8 cm cranial caudally by
2.5 cm AP dimension. This most likely is due to previous hematoma or
previous loculated pleural effusion or hematoma less likely early
previous aortic pseudoaneurysm. There is well-preserved fat plan be
thin this collection and descending aorta. No aortic leak is noted.

Atherosclerotic calcifications of coronary arteries. Small
pericardial effusion anteriorly measures 5 mm in maximum thickness.
Borderline cardiomegaly. Small hiatal hernia. Visualized upper
abdomen shows no adrenal gland mass. There is small right pleural
effusion. Trace left pleural effusion or pleural thickening.

Images of the lung parenchyma shows no segmental infiltrate. Mild
emphysematous changes are noted bilateral lower lobes. Mild
interstitial prominence bilateral lower lobes. Mild interstitial
edema cannot be excluded.

A precarinal lymph node measures 1 cm not pathologic by size
criteria. Central pulmonary artery is unremarkable. No central
pulmonary artery aneurysm.

Mild atelectasis noted bilateral lower lobe posteriorly.

In axial image 22 there is 8 mm nodule in right upper lobe. This is
confirmed on coronal image 121. Follow-up examination in 3 months is
recommended to assure stability.

Review of the MIP images confirms the above findings.
IMPRESSION: 1. No evidence of aortic aneurysm. There is peripheral calcified
collection measures 26.6 Hounsfield units in attenuation. Located
just posterior to descending aorta posterior mediastinum .This is
most likely due to previous calcified hematoma or loculated
effusion. Less likely AP views aortic aneurysm. There is well
preserved fat plane between the collection and descending aorta. The
collection measures 4.8 x 2.5 cm.
2. Bilateral small pleural effusion right greater than left.
Bilateral lower lobe posterior atelectasis. Central mild vascular
congestion and mild interstitial prominence especially lower lobe
suspicious for mild interstitial edema. Mild emphysematous changes.
3. Cardiomegaly is noted.  Small pericardial effusion.
4. There is 8 mm nodule in right upper lobe centrally. Follow-up
examination in 3 months is recommended to assure stability.
5. Multinodular enlargement of left lobe of thyroid gland. Largest
anterior nodule measures 3 cm. Further correlation with thyroid
gland ultrasound is recommended.

## 2015-10-09 ENCOUNTER — Other Ambulatory Visit: Payer: Self-pay | Admitting: Cardiology

## 2016-01-21 ENCOUNTER — Other Ambulatory Visit: Payer: Self-pay | Admitting: Cardiology

## 2016-02-19 ENCOUNTER — Other Ambulatory Visit: Payer: Self-pay | Admitting: Cardiology

## 2016-07-25 ENCOUNTER — Other Ambulatory Visit: Payer: Self-pay | Admitting: Cardiology

## 2016-08-10 ENCOUNTER — Other Ambulatory Visit: Payer: Self-pay | Admitting: Cardiology

## 2016-11-29 ENCOUNTER — Other Ambulatory Visit: Payer: Self-pay | Admitting: Cardiology
